# Patient Record
Sex: Female | Born: 1955 | Race: Black or African American | Hispanic: No | Smoking: Former smoker
Health system: Southern US, Community
[De-identification: ages and names within clinical notes are randomized; demographics above are authoritative.]

## PROBLEM LIST (undated history)

## (undated) DIAGNOSIS — E119 Type 2 diabetes mellitus without complications: Secondary | ICD-10-CM

## (undated) DIAGNOSIS — E785 Hyperlipidemia, unspecified: Secondary | ICD-10-CM

## (undated) DIAGNOSIS — C50919 Malignant neoplasm of unspecified site of unspecified female breast: Secondary | ICD-10-CM

## (undated) DIAGNOSIS — I10 Essential (primary) hypertension: Secondary | ICD-10-CM

## (undated) DIAGNOSIS — K219 Gastro-esophageal reflux disease without esophagitis: Secondary | ICD-10-CM

---

## 1991-11-08 DIAGNOSIS — C50919 Malignant neoplasm of unspecified site of unspecified female breast: Secondary | ICD-10-CM

## 1991-11-08 HISTORY — DX: Malignant neoplasm of unspecified site of unspecified female breast: C50.919

## 1991-11-08 HISTORY — PX: MASTECTOMY: SHX3

## 2004-09-17 ENCOUNTER — Ambulatory Visit: Payer: Self-pay

## 2004-11-22 ENCOUNTER — Ambulatory Visit: Payer: Self-pay | Admitting: Oncology

## 2004-12-29 ENCOUNTER — Ambulatory Visit: Payer: Self-pay | Admitting: Oncology

## 2005-01-03 ENCOUNTER — Ambulatory Visit: Payer: Self-pay | Admitting: Otolaryngology

## 2005-06-27 ENCOUNTER — Ambulatory Visit: Payer: Self-pay | Admitting: Oncology

## 2005-10-03 ENCOUNTER — Ambulatory Visit: Payer: Self-pay | Admitting: Oncology

## 2006-05-08 ENCOUNTER — Ambulatory Visit: Payer: Self-pay | Admitting: Gastroenterology

## 2006-06-22 ENCOUNTER — Ambulatory Visit: Payer: Self-pay | Admitting: Oncology

## 2006-10-04 ENCOUNTER — Ambulatory Visit: Payer: Self-pay | Admitting: Oncology

## 2007-10-08 ENCOUNTER — Ambulatory Visit: Payer: Self-pay | Admitting: *Deleted

## 2008-10-08 ENCOUNTER — Ambulatory Visit: Payer: Self-pay | Admitting: Family Medicine

## 2009-10-13 ENCOUNTER — Ambulatory Visit: Payer: Self-pay | Admitting: Family Medicine

## 2010-03-12 ENCOUNTER — Ambulatory Visit: Payer: Self-pay | Admitting: Family Medicine

## 2010-10-14 ENCOUNTER — Ambulatory Visit: Payer: Self-pay | Admitting: Family Medicine

## 2011-10-20 ENCOUNTER — Ambulatory Visit: Payer: Self-pay | Admitting: Family Medicine

## 2012-08-06 ENCOUNTER — Ambulatory Visit: Payer: Self-pay | Admitting: Gastroenterology

## 2012-08-08 LAB — PATHOLOGY REPORT

## 2012-11-20 ENCOUNTER — Ambulatory Visit: Payer: Self-pay

## 2013-01-18 ENCOUNTER — Ambulatory Visit: Payer: Self-pay | Admitting: Family Medicine

## 2013-01-24 ENCOUNTER — Emergency Department: Payer: Self-pay | Admitting: Emergency Medicine

## 2013-01-24 LAB — URINALYSIS, COMPLETE
Bilirubin,UR: NEGATIVE
Glucose,UR: 50 mg/dL (ref 0–75)
Nitrite: NEGATIVE
Protein: 30
Squamous Epithelial: 3
WBC UR: 3 /HPF (ref 0–5)

## 2013-01-24 LAB — CBC
HCT: 40 % (ref 35.0–47.0)
MCH: 28.9 pg (ref 26.0–34.0)
Platelet: 295 10*3/uL (ref 150–440)
RBC: 4.58 10*6/uL (ref 3.80–5.20)
WBC: 8 10*3/uL (ref 3.6–11.0)

## 2013-01-24 LAB — BASIC METABOLIC PANEL
Anion Gap: 8 (ref 7–16)
Co2: 27 mmol/L (ref 21–32)
EGFR (Non-African Amer.): >60
Glucose: 166 mg/dL — ABNORMAL HIGH (ref 65–99)
Osmolality: 283 (ref 275–301)
Potassium: 3.5 mmol/L (ref 3.5–5.1)
Sodium: 138 mmol/L (ref 136–145)

## 2013-10-31 ENCOUNTER — Emergency Department: Payer: Self-pay | Admitting: Emergency Medicine

## 2013-12-03 ENCOUNTER — Ambulatory Visit: Payer: Self-pay | Admitting: Family Medicine

## 2014-12-04 ENCOUNTER — Ambulatory Visit: Payer: Self-pay | Admitting: Family Medicine

## 2015-12-24 ENCOUNTER — Other Ambulatory Visit: Payer: Self-pay | Admitting: Family Medicine

## 2015-12-24 DIAGNOSIS — Z1231 Encounter for screening mammogram for malignant neoplasm of breast: Secondary | ICD-10-CM

## 2015-12-30 ENCOUNTER — Ambulatory Visit
Admission: RE | Admit: 2015-12-30 | Discharge: 2015-12-30 | Disposition: A | Discharge status: Home / Self Care | Payer: BC Managed Care – PPO | Source: Ambulatory Visit | Attending: Family Medicine | Admitting: Family Medicine

## 2015-12-30 DIAGNOSIS — Z1231 Encounter for screening mammogram for malignant neoplasm of breast: Secondary | ICD-10-CM | POA: Insufficient documentation

## 2015-12-30 HISTORY — DX: Malignant neoplasm of unspecified site of unspecified female breast: C50.919

## 2016-12-05 ENCOUNTER — Encounter: Payer: Self-pay | Admitting: Emergency Medicine

## 2016-12-05 ENCOUNTER — Emergency Department
Admission: EM | Admit: 2016-12-05 | Discharge: 2016-12-05 | Disposition: A | Discharge status: Home / Self Care | Payer: BC Managed Care – PPO | Attending: Emergency Medicine | Admitting: Emergency Medicine

## 2016-12-05 ENCOUNTER — Emergency Department: Payer: BC Managed Care – PPO

## 2016-12-05 DIAGNOSIS — S52615A Nondisplaced fracture of left ulna styloid process, initial encounter for closed fracture: Secondary | ICD-10-CM | POA: Diagnosis not present

## 2016-12-05 DIAGNOSIS — W1809XA Striking against other object with subsequent fall, initial encounter: Secondary | ICD-10-CM | POA: Diagnosis not present

## 2016-12-05 DIAGNOSIS — Y999 Unspecified external cause status: Secondary | ICD-10-CM | POA: Insufficient documentation

## 2016-12-05 DIAGNOSIS — E119 Type 2 diabetes mellitus without complications: Secondary | ICD-10-CM | POA: Diagnosis not present

## 2016-12-05 DIAGNOSIS — Y9389 Activity, other specified: Secondary | ICD-10-CM | POA: Diagnosis not present

## 2016-12-05 DIAGNOSIS — Y929 Unspecified place or not applicable: Secondary | ICD-10-CM | POA: Diagnosis not present

## 2016-12-05 DIAGNOSIS — I10 Essential (primary) hypertension: Secondary | ICD-10-CM | POA: Insufficient documentation

## 2016-12-05 DIAGNOSIS — Z853 Personal history of malignant neoplasm of breast: Secondary | ICD-10-CM | POA: Insufficient documentation

## 2016-12-05 DIAGNOSIS — S52552A Other extraarticular fracture of lower end of left radius, initial encounter for closed fracture: Secondary | ICD-10-CM | POA: Diagnosis not present

## 2016-12-05 DIAGNOSIS — S6992XA Unspecified injury of left wrist, hand and finger(s), initial encounter: Secondary | ICD-10-CM | POA: Diagnosis present

## 2016-12-05 HISTORY — DX: Type 2 diabetes mellitus without complications: E11.9

## 2016-12-05 HISTORY — DX: Essential (primary) hypertension: I10

## 2016-12-05 MED ORDER — OXYCODONE-ACETAMINOPHEN 5-325 MG PO TABS: 1.0000 | ORAL_TABLET | Freq: Three times a day (TID) | ORAL | 0 refills | Status: AC | PRN

## 2016-12-05 MED ORDER — OXYCODONE-ACETAMINOPHEN 5-325 MG PO TABS
1.0000 | ORAL_TABLET | Freq: Once | ORAL | Status: AC
  Administered 2016-12-05: 1 via ORAL
  Filled 2016-12-05: qty 1

## 2016-12-05 MED ORDER — MELOXICAM 7.5 MG PO TABS: 7.5000 mg | ORAL_TABLET | Freq: Every day | ORAL | 1 refills | Status: AC

## 2016-12-05 NOTE — ED Provider Notes (Signed)
[facility] Emergency Department Provider Note  ____________________________________________  Time seen: Approximately 12:49 PM  I have reviewed the triage vital signs and the nursing notes.   HISTORY  Chief Complaint Wrist Pain    HPI Janice Richmond is a 61 y.o. female presenting to the emergency department with 4/10 acute non-radiating left wrist pain. Patient states that left wrist pain is worsened with movement.  Patient was bent over picking up toys and lost her balance, falling onto her left outstretched hand. Patient did hit her head or lose consciousness during the fall. Patient denies prior traumas or surgeries to the left upper extremity. Patient is right-handed. She has been experiencing left upper extremity avoidance. Patient has used over-the-counter arthritis cream but has attempted no other alleviating measures. Patient enjoys watching her grandson and granddaughter in her spare time.   Past Medical History:  Diagnosis Date  . Breast cancer ([place]) 1993   left breast  . Diabetes mellitus without complication ([place])   . Hypertension     There are no active problems to display for this patient.   Past Surgical History:  Procedure Laterality Date  . MASTECTOMY Left 1993   mastectomy with chemotherapy    Prior to Admission medications   Medication Sig Start Date End Date Taking? Authorizing Provider  meloxicam (MOBIC) 7.5 MG tablet Take 1 tablet (7.5 mg total) by mouth daily. 12/05/16 12/10/16  Lannie Fields, PA-C  oxyCODONE-acetaminophen (ROXICET) 5-325 MG tablet Take 1 tablet by mouth every 8 (eight) hours as needed. 12/05/16 12/08/16  Lannie Fields, PA-C    Allergies Patient has no known allergies.  Family History  Problem Relation Age of Onset  . Breast cancer Other 40    did have BRCA testing and it was negative  . Pancreatic cancer Other 70    two nephews  . Breast cancer Sister 33    2 sisters, ages 31 and 40  . Pancreatic  cancer Mother 19    Social History Social History  Substance Use Topics  . Smoking status: Not on file  . Smokeless tobacco: Not on file  . Alcohol use Not on file     Review of Systems  Constitutional: No fever/chills Cardiovascular: no chest pain. Respiratory: no cough. No SOB. Gastrointestinal: No abdominal pain.  No nausea, no vomiting.  No diarrhea.  No constipation. Genitourinary: Negative for dysuria. No hematuria Musculoskeletal: Patient has left wrist pain.  Skin: Patient has edema of the left wrist.  Neurological: Negative for headaches, focal weakness or numbness.   ____________________________________________   PHYSICAL EXAM:  VITAL SIGNS: ED Triage Vitals  Enc Vitals Group     BP 12/05/16 1131 (!) 161/84     Pulse Rate 12/05/16 1131 69     Resp 12/05/16 1131 16     Temp 12/05/16 1131 98.8 F (37.1 C)     Temp Source 12/05/16 1131 Oral     SpO2 12/05/16 1131 98 %     Weight 12/05/16 1131 180 lb (81.6 kg)     Height 12/05/16 1131 '5\' 5"'  (1.651 m)     Head Circumference --      Peak Flow --      Pain Score 12/05/16 1132 4     Pain Loc --      Pain Edu? --      Excl. in [place]? --      Constitutional: Alert and oriented. Well appearing and in no acute distress. Eyes: Conjunctivae are normal. PERRL.  EOMI. Head: Atraumatic. Neck: No stridor. FROM. No pain with palpation along the C-spine. Cardiovascular: Normal rate, regular rhythm. Normal S1 and S2.  Good peripheral circulation. Respiratory: Normal respiratory effort without tachypnea or retractions. Lungs CTAB. Good air entry to the bases with no decreased or absent breath sounds. Musculoskeletal: To inspection, left wrist is edematous. Patient has 5 out of 5 strength in the upper extremities bilaterally. Patient has full range of motion at the left elbow and limited flexion and extension at the left wrist, likely secondary to pain. Patient is able to move all 5 left fingers. Palpable radial and ulnar pulses  bilaterally and symmetrically. Neurologic:  Normal speech and language. No gross focal neurologic deficits are appreciated. Reflexes are 2+ and symmetric in the lower extremities bilaterally. Skin:  Skin is warm, dry and intact. No rash noted. Psychiatric: Mood and affect are normal. Speech and behavior are normal. Patient exhibits appropriate insight and judgement.   ____________________________________________   LABS (all labs ordered are listed, but only abnormal results are displayed)  Labs Reviewed - No data to display ____________________________________________  EKG   ____________________________________________  RADIOLOGY Unk Pinto, personally viewed and evaluated these images (plain radiographs) as part of my medical decision making, as well as reviewing the written report by the radiologist.  Dg Wrist Complete Left  Result Date: 12/05/2016 CLINICAL DATA:  Fall on outstretched hand with left wrist pain, initial encounter EXAM: LEFT WRIST - COMPLETE 3+ VIEW COMPARISON:  None. FINDINGS: There is an oblique fracture through the distal radius as well as a transverse fracture through the ulnar styloid. Radial fracture somewhat comminuted and appears to extend ports the articular surface. No other fracture is seen. Specifically the scaphoid shows no fracture. IMPRESSION: Distal radial and ulnar fractures. Electronically Signed   By: Inez Catalina M.D.   On: 12/05/2016 12:01    ____________________________________________    PROCEDURES  Procedure(s) performed:    Procedures  SPLINT APPLICATION Date/Time: 9:37 PM Authorized by: Lannie Fields Consent: Verbal consent obtained. Risks and benefits: risks, benefits and alternatives were discussed Consent given by: patient Splint applied by: orthopedic technician Location details: Left wrist Splint type: Sugar tong  Post-procedure: The splinted body part was neurovascularly unchanged following the procedure. Patient  tolerance: Patient tolerated the procedure well with no immediate complications.   Medications  oxyCODONE-acetaminophen (PERCOCET/ROXICET) 5-325 MG per tablet 1 tablet (not administered)     ____________________________________________   INITIAL IMPRESSION / ASSESSMENT AND PLAN / ED COURSE  Pertinent labs & imaging results that were available during my care of the patient were reviewed by me and considered in my medical decision making (see chart for details).  Review of the [place] CSRS was performed in accordance of the [place] prior to dispensing any controlled drugs.     Assessment and Plan: Distal Radius Fracture: Ulnar Styloid Fracture: Patient presents to the emergency department with left wrist pain after sustaining a fall earlier today. Patient has an oblique fracture through the distal radius and a transverse fracture through the styloid process of ulna. A splint was applied in the emergency department and Percocet was given for pain. Patient was neurovascularly intact after splint application. Patient was discharged with Percocet and referred to orthopedics, Dr. Sabra Heck. Patient was advised to make an appointment as soon as possible. All patient questions were answered. ___________________________________________  FINAL CLINICAL IMPRESSION(S) / ED DIAGNOSES  Final diagnoses:  Other closed extra-articular fracture of distal end of left radius, initial encounter  Closed nondisplaced  fracture of styloid process of left ulna, initial encounter      NEW MEDICATIONS STARTED DURING THIS VISIT:  New Prescriptions   MELOXICAM (MOBIC) 7.5 MG TABLET    Take 1 tablet (7.5 mg total) by mouth daily.   OXYCODONE-ACETAMINOPHEN (ROXICET) 5-325 MG TABLET    Take 1 tablet by mouth every 8 (eight) hours as needed.        This chart was dictated using voice recognition software/Dragon. Despite best efforts to proofread, errors can occur which can change the meaning. Any change was purely  unintentional.    Lannie Fields, PA-C 12/05/16 1306    Harvest Dark, MD 12/05/16 1450

## 2016-12-05 NOTE — ED Notes (Signed)
See triage note  States she fell yesterday  Landed on left wrist  Positive swelling positive pulses

## 2016-12-05 NOTE — ED Triage Notes (Signed)
Pt here with left wrist pain after a fall last night, denies hitting head, denies LOC.

## 2017-03-09 ENCOUNTER — Other Ambulatory Visit: Payer: Self-pay | Admitting: Family Medicine

## 2017-03-09 DIAGNOSIS — Z1231 Encounter for screening mammogram for malignant neoplasm of breast: Secondary | ICD-10-CM

## 2017-04-20 ENCOUNTER — Ambulatory Visit
Admission: RE | Admit: 2017-04-20 | Discharge: 2017-04-20 | Disposition: A | Discharge status: Home / Self Care | Payer: BC Managed Care – PPO | Source: Ambulatory Visit | Attending: Family Medicine | Admitting: Family Medicine

## 2017-04-20 DIAGNOSIS — Z9012 Acquired absence of left breast and nipple: Secondary | ICD-10-CM | POA: Diagnosis not present

## 2017-04-20 DIAGNOSIS — Z1231 Encounter for screening mammogram for malignant neoplasm of breast: Secondary | ICD-10-CM | POA: Insufficient documentation

## 2017-04-20 DIAGNOSIS — Z853 Personal history of malignant neoplasm of breast: Secondary | ICD-10-CM | POA: Diagnosis not present

## 2018-04-03 ENCOUNTER — Other Ambulatory Visit: Payer: Self-pay | Admitting: Family Medicine

## 2018-04-03 DIAGNOSIS — Z1231 Encounter for screening mammogram for malignant neoplasm of breast: Secondary | ICD-10-CM

## 2018-04-23 ENCOUNTER — Ambulatory Visit
Admission: RE | Admit: 2018-04-23 | Discharge: 2018-04-23 | Disposition: A | Discharge status: Home / Self Care | Payer: BC Managed Care – PPO | Source: Ambulatory Visit | Attending: Family Medicine | Admitting: Family Medicine

## 2018-04-23 DIAGNOSIS — Z1231 Encounter for screening mammogram for malignant neoplasm of breast: Secondary | ICD-10-CM | POA: Insufficient documentation

## 2019-04-12 ENCOUNTER — Other Ambulatory Visit: Payer: Self-pay | Admitting: Family Medicine

## 2019-04-12 DIAGNOSIS — Z1231 Encounter for screening mammogram for malignant neoplasm of breast: Secondary | ICD-10-CM

## 2019-05-24 ENCOUNTER — Other Ambulatory Visit: Payer: Self-pay

## 2019-05-24 ENCOUNTER — Ambulatory Visit
Admission: RE | Admit: 2019-05-24 | Discharge: 2019-05-24 | Disposition: A | Discharge status: Home / Self Care | Payer: BC Managed Care – PPO | Source: Ambulatory Visit | Attending: Family Medicine | Admitting: Family Medicine

## 2019-05-24 DIAGNOSIS — Z1231 Encounter for screening mammogram for malignant neoplasm of breast: Secondary | ICD-10-CM | POA: Diagnosis present

## 2020-04-16 ENCOUNTER — Other Ambulatory Visit: Payer: Self-pay | Admitting: Family Medicine

## 2020-04-16 DIAGNOSIS — Z1231 Encounter for screening mammogram for malignant neoplasm of breast: Secondary | ICD-10-CM

## 2020-05-25 ENCOUNTER — Ambulatory Visit
Admission: RE | Admit: 2020-05-25 | Discharge: 2020-05-25 | Disposition: A | Discharge status: Home / Self Care | Payer: BC Managed Care – PPO | Source: Ambulatory Visit | Attending: Family Medicine | Admitting: Family Medicine

## 2020-05-25 DIAGNOSIS — Z1231 Encounter for screening mammogram for malignant neoplasm of breast: Secondary | ICD-10-CM | POA: Insufficient documentation

## 2021-01-31 ENCOUNTER — Emergency Department: Payer: BC Managed Care – PPO

## 2021-01-31 ENCOUNTER — Observation Stay: Payer: BC Managed Care – PPO

## 2021-01-31 ENCOUNTER — Other Ambulatory Visit: Payer: Self-pay

## 2021-01-31 ENCOUNTER — Encounter: Payer: Self-pay | Admitting: Emergency Medicine

## 2021-01-31 ENCOUNTER — Observation Stay
Admission: EM | Admit: 2021-01-31 | Discharge: 2021-02-03 | Disposition: A | Discharge status: Home / Self Care | Payer: BC Managed Care – PPO | Attending: Internal Medicine | Admitting: Internal Medicine

## 2021-01-31 DIAGNOSIS — E119 Type 2 diabetes mellitus without complications: Secondary | ICD-10-CM | POA: Diagnosis not present

## 2021-01-31 DIAGNOSIS — E1169 Type 2 diabetes mellitus with other specified complication: Secondary | ICD-10-CM

## 2021-01-31 DIAGNOSIS — K219 Gastro-esophageal reflux disease without esophagitis: Secondary | ICD-10-CM | POA: Diagnosis present

## 2021-01-31 DIAGNOSIS — I10 Essential (primary) hypertension: Secondary | ICD-10-CM | POA: Diagnosis not present

## 2021-01-31 DIAGNOSIS — Z853 Personal history of malignant neoplasm of breast: Secondary | ICD-10-CM | POA: Diagnosis not present

## 2021-01-31 DIAGNOSIS — I639 Cerebral infarction, unspecified: Secondary | ICD-10-CM | POA: Insufficient documentation

## 2021-01-31 DIAGNOSIS — Z87891 Personal history of nicotine dependence: Secondary | ICD-10-CM | POA: Diagnosis not present

## 2021-01-31 DIAGNOSIS — Z20822 Contact with and (suspected) exposure to covid-19: Secondary | ICD-10-CM | POA: Insufficient documentation

## 2021-01-31 DIAGNOSIS — H53461 Homonymous bilateral field defects, right side: Principal | ICD-10-CM | POA: Insufficient documentation

## 2021-01-31 DIAGNOSIS — I428 Other cardiomyopathies: Secondary | ICD-10-CM | POA: Diagnosis not present

## 2021-01-31 DIAGNOSIS — Z79899 Other long term (current) drug therapy: Secondary | ICD-10-CM | POA: Diagnosis not present

## 2021-01-31 DIAGNOSIS — Z7984 Long term (current) use of oral hypoglycemic drugs: Secondary | ICD-10-CM | POA: Insufficient documentation

## 2021-01-31 DIAGNOSIS — I429 Cardiomyopathy, unspecified: Secondary | ICD-10-CM

## 2021-01-31 DIAGNOSIS — E785 Hyperlipidemia, unspecified: Secondary | ICD-10-CM | POA: Insufficient documentation

## 2021-01-31 DIAGNOSIS — R519 Headache, unspecified: Secondary | ICD-10-CM | POA: Diagnosis present

## 2021-01-31 HISTORY — DX: Gastro-esophageal reflux disease without esophagitis: K21.9

## 2021-01-31 HISTORY — DX: Hyperlipidemia, unspecified: E78.5

## 2021-01-31 LAB — URINE DRUG SCREEN, QUALITATIVE (ARMC ONLY)
Amphetamines, Ur Screen: NOT DETECTED
Barbiturates, Ur Screen: NOT DETECTED
Benzodiazepine, Ur Scrn: NOT DETECTED
Cannabinoid 50 Ng, Ur ~~LOC~~: NOT DETECTED
Cocaine Metabolite,Ur ~~LOC~~: NOT DETECTED
MDMA (Ecstasy)Ur Screen: NOT DETECTED
Methadone Scn, Ur: NOT DETECTED
Opiate, Ur Screen: NOT DETECTED
Phencyclidine (PCP) Ur S: NOT DETECTED
Tricyclic, Ur Screen: NOT DETECTED

## 2021-01-31 LAB — DIFFERENTIAL
Abs Immature Granulocytes: 0.04 10*3/uL (ref 0.00–0.07)
Basophils Absolute: 0.1 10*3/uL (ref 0.0–0.1)
Basophils Relative: 1 %
Eosinophils Absolute: 0.1 10*3/uL (ref 0.0–0.5)
Eosinophils Relative: 1 %
Immature Granulocytes: 1 %
Lymphocytes Relative: 18 %
Lymphs Abs: 1.5 10*3/uL (ref 0.7–4.0)
Monocytes Absolute: 0.6 10*3/uL (ref 0.1–1.0)
Monocytes Relative: 7 %
Neutro Abs: 6.1 10*3/uL (ref 1.7–7.7)
Neutrophils Relative %: 72 %

## 2021-01-31 LAB — APTT: aPTT: 26 seconds (ref 24–36)

## 2021-01-31 LAB — URINALYSIS, ROUTINE W REFLEX MICROSCOPIC
Bacteria, UA: NONE SEEN
Bilirubin Urine: NEGATIVE
Glucose, UA: 150 mg/dL — AB
Ketones, ur: NEGATIVE mg/dL
Leukocytes,Ua: NEGATIVE
Nitrite: NEGATIVE
Protein, ur: 30 mg/dL — AB
Specific Gravity, Urine: 1.031 — ABNORMAL HIGH (ref 1.005–1.030)
pH: 6 (ref 5.0–8.0)

## 2021-01-31 LAB — COMPREHENSIVE METABOLIC PANEL
ALT: 24 U/L (ref 0–44)
AST: 22 U/L (ref 15–41)
Albumin: 4.1 g/dL (ref 3.5–5.0)
Alkaline Phosphatase: 81 U/L (ref 38–126)
Anion gap: 10 (ref 5–15)
BUN: 16 mg/dL (ref 8–23)
CO2: 24 mmol/L (ref 22–32)
Calcium: 8.9 mg/dL (ref 8.9–10.3)
Chloride: 103 mmol/L (ref 98–111)
Creatinine, Ser: 0.5 mg/dL (ref 0.44–1.00)
GFR, Estimated: >60 mL/min (ref >60)
Glucose, Bld: 189 mg/dL — ABNORMAL HIGH (ref 70–99)
Potassium: 3.8 mmol/L (ref 3.5–5.1)
Sodium: 137 mmol/L (ref 135–145)
Total Bilirubin: 0.5 mg/dL (ref 0.3–1.2)
Total Protein: 7.7 g/dL (ref 6.5–8.1)

## 2021-01-31 LAB — CBG MONITORING, ED: Glucose-Capillary: 179 mg/dL — ABNORMAL HIGH (ref 70–99)

## 2021-01-31 LAB — GLUCOSE, CAPILLARY
Glucose-Capillary: 179 mg/dL — ABNORMAL HIGH (ref 70–99)
Glucose-Capillary: 185 mg/dL — ABNORMAL HIGH (ref 70–99)
Glucose-Capillary: 169 mg/dL — ABNORMAL HIGH (ref 70–99)

## 2021-01-31 LAB — CBC
HCT: 44.5 % (ref 36.0–46.0)
Hemoglobin: 13.9 g/dL (ref 12.0–15.0)
MCH: 28.1 pg (ref 26.0–34.0)
MCHC: 31.2 g/dL (ref 30.0–36.0)
MCV: 90.1 fL (ref 80.0–100.0)
Platelets: 270 10*3/uL (ref 150–400)
RBC: 4.94 MIL/uL (ref 3.87–5.11)
RDW: 15 % (ref 11.5–15.5)
WBC: 8.3 10*3/uL (ref 4.0–10.5)
nRBC: 0 % (ref 0.0–0.2)

## 2021-01-31 LAB — PROTIME-INR
INR: 1.1 (ref 0.8–1.2)
Prothrombin Time: 13.6 seconds (ref 11.4–15.2)

## 2021-01-31 LAB — BRAIN NATRIURETIC PEPTIDE: B Natriuretic Peptide: 382.1 pg/mL — ABNORMAL HIGH (ref 0.0–100.0)

## 2021-01-31 LAB — RESP PANEL BY RT-PCR (FLU A&B, COVID) ARPGX2
Influenza A by PCR: NEGATIVE
Influenza B by PCR: NEGATIVE
SARS Coronavirus 2 by RT PCR: NEGATIVE

## 2021-01-31 LAB — ETHANOL: Alcohol, Ethyl (B): <10 mg/dL (ref <10)

## 2021-01-31 LAB — I-STAT CREATININE, ED: Creatinine, Ser: 0.5 mg/dL (ref 0.44–1.00)

## 2021-01-31 MED ORDER — LATANOPROST 0.005 % OP SOLN
1.0000 [drp] | Freq: Every day | OPHTHALMIC | Status: DC
  Administered 2021-01-31 – 2021-02-02 (×3): 1 [drp] via OPHTHALMIC
  Filled 2021-01-31: qty 2.5

## 2021-01-31 MED ORDER — ASPIRIN EC 325 MG PO TBEC
325.0000 mg | DELAYED_RELEASE_TABLET | Freq: Every day | ORAL | Status: DC
  Administered 2021-01-31: 325 mg via ORAL
  Filled 2021-01-31: qty 1

## 2021-01-31 MED ORDER — ACETAMINOPHEN 325 MG PO TABS
650.0000 mg | ORAL_TABLET | ORAL | Status: DC | PRN
  Administered 2021-01-31: 650 mg via ORAL
  Filled 2021-01-31: qty 2

## 2021-01-31 MED ORDER — ASPIRIN 300 MG RE SUPP
300.0000 mg | Freq: Every day | RECTAL | Status: DC
  Filled 2021-01-31: qty 1

## 2021-01-31 MED ORDER — STROKE: EARLY STAGES OF RECOVERY BOOK: Freq: Once | Status: AC

## 2021-01-31 MED ORDER — SENNOSIDES-DOCUSATE SODIUM 8.6-50 MG PO TABS: 1.0000 | ORAL_TABLET | Freq: Every evening | ORAL | Status: DC | PRN

## 2021-01-31 MED ORDER — INSULIN ASPART 100 UNIT/ML ~~LOC~~ SOLN: 0.0000 [IU] | Freq: Every day | SUBCUTANEOUS | Status: DC

## 2021-01-31 MED ORDER — ACETAMINOPHEN 650 MG RE SUPP: 650.0000 mg | RECTAL | Status: DC | PRN

## 2021-01-31 MED ORDER — IOHEXOL 350 MG/ML SOLN
75.0000 mL | Freq: Once | INTRAVENOUS | Status: AC | PRN
  Administered 2021-01-31: 75 mL via INTRAVENOUS

## 2021-01-31 MED ORDER — ENOXAPARIN SODIUM 40 MG/0.4ML ~~LOC~~ SOLN
0.5000 mg/kg | SUBCUTANEOUS | Status: DC
  Filled 2021-01-31: qty 0.8

## 2021-01-31 MED ORDER — INSULIN ASPART 100 UNIT/ML ~~LOC~~ SOLN
0.0000 [IU] | Freq: Three times a day (TID) | SUBCUTANEOUS | Status: DC
  Administered 2021-01-31 (×2): 2 [IU] via SUBCUTANEOUS
  Administered 2021-02-01: 13:00:00 3 [IU] via SUBCUTANEOUS
  Administered 2021-02-01 – 2021-02-03 (×5): 2 [IU] via SUBCUTANEOUS
  Filled 2021-01-31 (×5): qty 1

## 2021-01-31 MED ORDER — ATORVASTATIN CALCIUM 20 MG PO TABS: 80.0000 mg | ORAL_TABLET | Freq: Every day | ORAL | Status: DC

## 2021-01-31 MED ORDER — ASPIRIN 300 MG RE SUPP: 300.0000 mg | Freq: Every day | RECTAL | Status: DC

## 2021-01-31 MED ORDER — ATORVASTATIN CALCIUM 20 MG PO TABS
80.0000 mg | ORAL_TABLET | Freq: Every day | ORAL | Status: DC
  Administered 2021-01-31: 80 mg via ORAL
  Filled 2021-01-31 (×2): qty 4

## 2021-01-31 MED ORDER — HYDRALAZINE HCL 20 MG/ML IJ SOLN: 5.0000 mg | INTRAMUSCULAR | Status: DC | PRN

## 2021-01-31 MED ORDER — PANTOPRAZOLE SODIUM 40 MG PO TBEC
40.0000 mg | DELAYED_RELEASE_TABLET | Freq: Every day | ORAL | Status: DC
  Administered 2021-01-31 – 2021-02-03 (×4): 40 mg via ORAL
  Filled 2021-01-31 (×4): qty 1

## 2021-01-31 MED ORDER — CLOPIDOGREL BISULFATE 75 MG PO TABS
75.0000 mg | ORAL_TABLET | Freq: Every day | ORAL | Status: DC
  Administered 2021-02-01 – 2021-02-03 (×3): 75 mg via ORAL
  Filled 2021-01-31 (×3): qty 1

## 2021-01-31 MED ORDER — ASPIRIN EC 81 MG PO TBEC
81.0000 mg | DELAYED_RELEASE_TABLET | Freq: Every day | ORAL | Status: DC
  Administered 2021-02-01 – 2021-02-03 (×3): 81 mg via ORAL
  Filled 2021-01-31 (×3): qty 1

## 2021-01-31 MED ORDER — CLOPIDOGREL BISULFATE 75 MG PO TABS
300.0000 mg | ORAL_TABLET | Freq: Once | ORAL | Status: AC
  Administered 2021-01-31: 17:00:00 300 mg via ORAL
  Filled 2021-01-31: qty 4

## 2021-01-31 MED ORDER — HYDROXYZINE HCL 50 MG/ML IM SOLN
25.0000 mg | Freq: Four times a day (QID) | INTRAMUSCULAR | Status: DC | PRN
  Filled 2021-01-31: qty 0.5

## 2021-01-31 MED ORDER — ASPIRIN 325 MG PO TABS
325.0000 mg | ORAL_TABLET | Freq: Every day | ORAL | Status: DC
  Filled 2021-01-31 (×2): qty 1

## 2021-01-31 MED ORDER — IOHEXOL 350 MG/ML SOLN: 100.0000 mL | Freq: Once | INTRAVENOUS | Status: DC | PRN

## 2021-01-31 MED ORDER — TIMOLOL MALEATE 0.5 % OP SOLN
1.0000 [drp] | Freq: Every day | OPHTHALMIC | Status: DC
  Administered 2021-01-31 – 2021-02-03 (×4): 1 [drp] via OPHTHALMIC
  Filled 2021-01-31: qty 5

## 2021-01-31 MED ORDER — CARVEDILOL 25 MG PO TABS
25.0000 mg | ORAL_TABLET | Freq: Two times a day (BID) | ORAL | Status: DC
  Administered 2021-01-31 – 2021-02-03 (×7): 25 mg via ORAL
  Filled 2021-01-31 (×7): qty 1

## 2021-01-31 MED ORDER — ATORVASTATIN CALCIUM 20 MG PO TABS: 40.0000 mg | ORAL_TABLET | Freq: Every day | ORAL | Status: DC

## 2021-01-31 MED ORDER — ACETAMINOPHEN 160 MG/5ML PO SOLN
650.0000 mg | ORAL | Status: DC | PRN
  Filled 2021-01-31: qty 20.3

## 2021-01-31 NOTE — ED Notes (Signed)
Neurologist at patients bedside in [place] at this time

## 2021-01-31 NOTE — Progress Notes (Signed)
PHARMACIST - PHYSICIAN COMMUNICATION  CONCERNING:  Enoxaparin (Lovenox) for DVT Prophylaxis    RECOMMENDATION: Patient was prescribed enoxaprin 40mg  q24 hours for VTE prophylaxis.   Filed Weights   01/31/21 0847 01/31/21 0942  Weight: 81.6 kg (180 lb) 83.9 kg (185 lb)    Body mass index is 30.79 kg/m.  Estimated Creatinine Clearance: 77 mL/min (by C-G formula based on SCr of 0.5 mg/dL).   Based on [place] patient is candidate for enoxaparin 0.5mg /kg TBW SQ every 24 hours based on BMI being >30.   DESCRIPTION: Pharmacy has adjusted enoxaparin dose [facility].  Patient is now receiving enoxaparin 42.5 mg every 24 hours   Benn Moulder, PharmD Pharmacy Resident  01/31/2021 10:29 AM

## 2021-01-31 NOTE — Evaluation (Signed)
Occupational Therapy Evaluation Patient Details Name: Janice Richmond MRN: [id number] DOB: 18-Dec-1955 Today's Date: 01/31/2021    History of Present Illness 65 y.o. female with medical history significant of hypertension, HLD, GERD, diabetes mellitus, left breast cancer 1993 (s/p for mastectomy and chemotherapy), who presents with blurry vision and headache. Pt with new onset of acute L occupital infarct.   Clinical Impression   Patient presenting with decreased I in self care, balance, functional mobility, and vision disturbance. Patient reports living with 2 children and 2 grandchildren PTA. Pt is independent in all aspects of self care, IADLs, and mobility without use of AD.  Patient currently functioning at supervision - min guard without use of AD. Pt does endorse blurry vision on R side. When objects presented on the R pt able to locate with cuing for head turn. Pt given visual scanning task with instructions to leave at midline and pt noted to compensate without cuing with multiple head turns and checking work twice for errors. Pt also performing some degree of furniture walking from visual disturbance making her feel "off" as she reports.  Patient will benefit from acute OT to increase overall independence in the areas of ADLs, functional mobility, and safety awareness in order to safely discharge home with family.    Follow Up Recommendations  Outpatient OT;Supervision - Intermittent    Equipment Recommendations  None recommended by OT       Precautions / Restrictions Precautions Precautions: Fall      Mobility Bed Mobility Overal bed mobility: Modified Independent             General bed mobility comments: no physical assistance given    Transfers Overall transfer level: Needs assistance Equipment used: None Transfers: Sit to/from Stand;Stand Pivot Transfers Sit to Stand: Supervision Stand pivot transfers: Min guard            Balance Overall balance  assessment: Needs assistance Sitting-balance support: Feet supported Sitting balance-Leahy Scale: Good     Standing balance support: During functional activity Standing balance-Leahy Scale: Fair Standing balance comment: quick head turns and increased cuing for visual scanning requiring min guard for balance                           ADL either performed or assessed with clinical judgement   ADL                                         General ADL Comments: supervision - min guard for self care tasks and functional mobility without use of AD.     Vision Baseline Vision/History: Wears glasses Wears Glasses: At all times Patient Visual Report: Blurring of vision Vision Assessment?: Yes;Vision impaired- to be further tested in functional context Additional Comments: Pt does not see items introducted from R side during this session unless cued. Pt appears to have possible R hemianopsia. Pt is compensating well during session.            Pertinent Vitals/Pain Pain Assessment: No/denies pain     Hand Dominance Right   Extremity/Trunk Assessment Upper Extremity Assessment Upper Extremity Assessment: Overall WFL for tasks assessed   Lower Extremity Assessment Lower Extremity Assessment: Overall WFL for tasks assessed       Communication Communication Communication: No difficulties   Cognition Arousal/Alertness: Awake/alert Behavior During Therapy: WFL for tasks assessed/performed Overall Cognitive  Status: Within Functional Limits for tasks assessed                                                Home Living Family/patient expects to be discharged to:: Private residence Living Arrangements: Children Available Help at Discharge: Available 24 hours/day Type of Home: House Home Access: [place]: [place]: None          Prior Functioning/Environment Level of  Independence: Independent        Comments: Pt reports living with 2 children and 2 grandchildren (ages 66 and 50). She is independent in all aspects of mobility, IADLs, and ADLs. Pt takes sink bath for safety.        OT Problem List: Decreased strength;Decreased safety awareness;Impaired balance (sitting and/or standing)      OT Treatment/Interventions: Self-care/ADL training;Therapeutic exercise;Energy conservation;Manual therapy;Patient/family education;Balance training;Therapeutic activities;Visual/perceptual remediation/compensation    OT Goals(Current goals can be found in the care plan section) Acute Rehab OT Goals Patient Stated Goal: to go home and get better OT Goal Formulation: With patient Time For Goal Achievement: 02/14/21 Potential to Achieve Goals: Fair ADL Goals Pt Will Transfer to Toilet: with modified independence Pt Will Perform Toileting - Clothing Manipulation and hygiene: with modified independence Additional ADL Goal #1: Pt will utilize visual strategies in order to scan and locate all needed items for self care tasks with increased time.  OT Frequency: Min 2X/week   Barriers to D/C:    none known at this time          AM-PAC OT "6 Clicks" Daily Activity     Outcome Measure Help from another person eating meals?: None Help from another person taking care of personal grooming?: None Help from another person toileting, which includes using toliet, bedpan, or urinal?: A Little Help from another person bathing (including washing, rinsing, drying)?: A Little Help from another person to put on and taking off regular upper body clothing?: None Help from another person to put on and taking off regular lower body clothing?: A Little 6 Click Score: 21   End of Session Nurse Communication: Mobility status  Activity Tolerance: Patient tolerated treatment well Patient left: in bed;with call bell/phone within reach  OT Visit Diagnosis: Unsteadiness on feet  (R26.81);Other (comment) (R hemianopsia) Hemiplegia - caused by: Cerebral infarction                Time: 6803-2122 OT Time Calculation (min): 21 min Charges:  OT General Charges $OT Visit: 1 Visit OT Evaluation $OT Eval Low Complexity: 1 Low OT Treatments $Therapeutic Activity: 8-22 mins  01/31/2021, 2:52 PM

## 2021-01-31 NOTE — Progress Notes (Signed)
OT Cancellation Note  Patient Details Name: Janice Richmond MRN: [id number] DOB: May 04, 1956   Cancelled Treatment:    Reason Eval/Treat Not Completed: Patient at procedure or test/ unavailable. Pt currently off the floor for MRI. OT will follow up when pt is next available.  Darleen Crocker, [place], OTR/L , CBIS ascom [phone]  01/31/21, 1:27 PM   01/31/2021, 1:27 PM

## 2021-01-31 NOTE — ED Notes (Signed)
MD Niu at bedside  

## 2021-01-31 NOTE — ED Provider Notes (Signed)
[facility] Emergency Department Provider Note ____________________________________________   Event Date/Time   First MD Initiated Contact with Patient 01/31/21 [phone]     (approximate)  I have reviewed the triage vital signs and the nursing notes.  HISTORY  Chief Complaint Headache and Blurred Vision   HPI Janice Richmond is a 65 y.o. femalewho presents to the ED for evaluation of headache and blurred vision.   Chart review indicates hx HTN, HLD, DM.  History of glaucoma.  Patient visited ED for evaluation of acute blurry vision and dizziness that started last night around midnight.  Patient reports being seated watching television when she had sudden onset dizziness, nausea and vision changes.  She reports going to bed hoping that her symptoms would improve, but awakening this morning with persistent symptoms, so she presents to the ED for evaluation.    She denies any syncopal episodes and minimizes her headache at this point, indicating that she just has blurry vision.  She reports increased nausea sensation while riding in the car without emesis.  Denies waxing and waning symptoms, and reports persistent blurry vision.  Denies recent illnesses, fevers, syncopal episodes, recent surgeries or interventions.  Denies gait changes, falls.  This has never happened before.  Past Medical History:  Diagnosis Date  . Breast cancer ([place]) 1993   left breast  . Diabetes mellitus without complication ([place])   . Hypertension     There are no problems to display for this patient.   Past Surgical History:  Procedure Laterality Date  . MASTECTOMY Left 1993   mastectomy with chemotherapy    Prior to Admission medications   Not on File    Allergies Patient has no known allergies.  Family History  Problem Relation Age of Onset  . Breast cancer Other 40       did have BRCA testing and it was negative  . Pancreatic cancer Other 31       two nephews  .  Breast cancer Sister 20       2 sisters, ages 40 and 75  . Pancreatic cancer Mother 66    Social History    Review of Systems  Constitutional: No fever/chills Eyes: No visual changes. ENT: No sore throat. Cardiovascular: Denies chest pain. Respiratory: Denies shortness of breath. Gastrointestinal: No abdominal pain.  No nausea, no vomiting.  No diarrhea.  No constipation. Genitourinary: Negative for dysuria. Musculoskeletal: Negative for back pain. Skin: Negative for rash. Neurological: Negative for focal weakness or numbness.  Positive for headache and blurry vision.  ____________________________________________   PHYSICAL EXAM:  VITAL SIGNS: Vitals:   01/31/21 0943 01/31/21 0945  BP:    Pulse: (!) 108 (!) 102  Resp: (!) 21 (!) 23  Temp:    SpO2: (!) 89% 94%     Constitutional: Alert and oriented. Well appearing and in no acute distress. Eyes: Conjunctivae are normal. PERRL. EOMI. Head: Atraumatic. Nose: No congestion/rhinnorhea. Mouth/Throat: Mucous membranes are moist.  Oropharynx non-erythematous. Neck: No stridor. No cervical spine tenderness to palpation. Cardiovascular: Normal rate, regular rhythm. Grossly normal heart sounds.  Good peripheral circulation. Respiratory: Normal respiratory effort.  No retractions. Lungs CTAB. Gastrointestinal: Soft , nondistended, nontender to palpation. No CVA tenderness. Musculoskeletal: No lower extremity tenderness nor edema.  No joint effusions. No signs of acute trauma. Neurologic:  homonymous hemianopsia to the right-sided visual fields is only apparent deficit.  Cranial nerves otherwise intact.  5/5 strength and sensation to all 4 extremities. No dysmetria to  finger-nose-finger Skin:  Skin is warm, dry and intact. No rash noted. Psychiatric: Mood and affect are normal. Speech and behavior are normal.  ____________________________________________   LABS (all labs ordered are listed, but only abnormal results are  displayed)  Labs Reviewed  COMPREHENSIVE METABOLIC PANEL - Abnormal; Notable for the following components:      Result Value   Glucose, Bld 189 (*)    All other components within normal limits  CBG MONITORING, ED - Abnormal; Notable for the following components:   Glucose-Capillary 179 (*)    All other components within normal limits  RESP PANEL BY RT-PCR (FLU A&B, COVID) ARPGX2  ETHANOL  PROTIME-INR  APTT  CBC  DIFFERENTIAL  URINE DRUG SCREEN, QUALITATIVE ([facility] ONLY)  URINALYSIS, ROUTINE W REFLEX MICROSCOPIC  I-STAT CREATININE, ED   ____________________________________________  12 Lead EKG  Sinus rhythm, rate of 105 bpm.  Normal axis.  Left bundle branch block with otherwise normal intervals.  No evidence of acute ischemia per [place] ____________________________________________  RADIOLOGY  ED MD interpretation: CT head reviewed by me with left-sided occipital hypodensity concerning for completed acute infarct  Official radiology report(s): CT HEAD CODE STROKE WO CONTRAST  Result Date: 01/31/2021 CLINICAL DATA:  Code stroke.  Right hemianopsia. EXAM: CT HEAD WITHOUT CONTRAST TECHNIQUE: Contiguous axial images were obtained from the base of the skull through the vertex without intravenous contrast. COMPARISON:  None. FINDINGS: Brain: Completed infarct in the left occipital lobe which is PCA branch territory and involves the visual cortex. No areas of prior infarct. No hemorrhage. Vascular: No hyperdense vessel or unexpected calcification. Skull: Normal. Negative for fracture or focal lesion. Sinuses/Orbits: No acute finding. Other: These results were called by telephone at the time of interpretation on 01/31/2021 at 9:23 am to provider [facility] , who verbally acknowledged these results. ASPECTS [facility] Stroke Program Early CT Score) Not scored with this history IMPRESSION: Acute infarct in the left occipital lobe. Electronically Signed   By: Monte Fantasia M.D.   On:  01/31/2021 09:24    ____________________________________________   PROCEDURES and INTERVENTIONS  Procedure(s) performed (including Critical Care):  .1-3 Lead EKG Interpretation Performed by: Vladimir Crofts, MD Authorized by: Vladimir Crofts, MD     Interpretation: normal     ECG rate:  90   ECG rate assessment: normal     Rhythm: sinus rhythm     Ectopy: none     Conduction: normal   .Critical Care Performed by: Vladimir Crofts, MD Authorized by: Vladimir Crofts, MD   Critical care provider statement:    Critical care time (minutes):  35   Critical care was necessary to treat or prevent imminent or life-threatening deterioration of the following conditions:  CNS failure or compromise   Critical care was time spent personally by me on the following activities:  Discussions with consultants, evaluation of patient's response to treatment, examination of patient, ordering and performing treatments and interventions, ordering and review of laboratory studies, ordering and review of radiographic studies, pulse oximetry, re-evaluation of patient's condition, obtaining history from patient or surrogate and review of old charts    Medications  iohexol (OMNIPAQUE) 350 MG/ML injection 75 mL (75 mLs Intravenous Contrast Given 01/31/21 0929)    ____________________________________________   MDM / ED COURSE   65 year old woman with appropriate risk factors presents to the ED with acute right-sided homonymous hemianopsia with evidence of acute CVA requiring medical admission.  Slightly hypertensive, which we will allow, otherwise hemodynamically stable on room air.  Exam with  isolated visual changes consistent with right-sided homonymous hemianopsia, otherwise no apparent neurologic deficits upon my examination.  Code stroke called due to the acuity of her symptoms.  CT imaging demonstrates completed infarct to her left occipital lobe, consistent with her symptoms.  She otherwise looks well without  evidence of ICH, sepsis or recent illnesses, trauma or significant metabolic derangements.  CTA head pending at the time of admission, and neurology indicates that these results will not change her plan as she is not a candidate for thrombectomy at this time due to infarct being completed.  We will discussed the case with hospitalist medicine for admission for further work-up and management.   Clinical Course as of 01/31/21 [id number]  Nancy Fetter Jan 31, 2021  0905 Code stroke called [DS]  0922 Call from rads, completed infarct to left occipital lobe [DS]  [phone] Neurology has seen the patient, ordered CTA head and recommends hospitalist admit. No TPA or IR intervention indicated [DS]    Clinical Course User Index [DS] Vladimir Crofts, MD    ____________________________________________   FINAL CLINICAL IMPRESSION(S) / ED DIAGNOSES  Final diagnoses:  Right homonymous hemianopsia  Cerebrovascular accident (CVA), unspecified mechanism [facility])     ED Discharge Orders    None       Maximiano Lott   Note:  This document was prepared using Dragon voice recognition software and may include unintentional dictation errors.   Vladimir Crofts, MD 01/31/21 [phone]

## 2021-01-31 NOTE — Consult Note (Signed)
Neurology Consultation Reason for Consult: Visual change Referring Physician: Charlsie Quest  CC: Visual change  History is obtained from: Patient  HPI: Janice Richmond is a 65 y.o. female with a history of hypertension and DM who presents with visual changes that started last night.  She noticed them more prominently around 2 AM.  Given that she was Lucianne Lei positive, a code stroke was activated on arrival and she was taken emergently for CT/CTA which demonstrates a PCA territory infarct with an occlusion in that distribution.  She denies any numbness, weakness, double vision, or other symptoms.   LKW: 11pm last night tpa given?: no, outside window   ROS: A 14 point ROS was performed and is negative except as noted in the HPI.  Past Medical History:  Diagnosis Date  . Breast cancer ([place]) 1993   left breast  . Diabetes mellitus without complication ([place])   . Hypertension      Family History  Problem Relation Age of Onset  . Breast cancer Other 40       did have BRCA testing and it was negative  . Pancreatic cancer Other 23       two nephews  . Breast cancer Sister 30       2 sisters, ages 70 and 19  . Pancreatic cancer Mother 58     Social History:  reports that she has quit smoking. She has never used smokeless tobacco. She reports that she does not drink alcohol and does not use drugs.   Exam: Current vital signs: BP (!) 145/96   Pulse 95   Temp 98.2 F (36.8 C) (Oral)   Resp (!) 21   Ht '5\' 5"'  (1.651 m)   Wt 83.9 kg   SpO2 94%   BMI 30.79 kg/m  Vital signs in last 24 hours: Temp:  [98.2 F (36.8 C)] 98.2 F (36.8 C) (03/27 0837) Pulse Rate:  [88-108] 95 (03/27 1030) Resp:  [16-23] 21 (03/27 1030) BP: (145-156)/(79-96) 145/96 (03/27 1030) SpO2:  [89 %-100 %] 94 % (03/27 1030) Weight:  [81.6 kg-83.9 kg] 83.9 kg (03/27 0942)   Physical Exam  Constitutional: Appears well-developed and well-nourished.  Psych: Affect appropriate to situation Eyes: No scleral  injection HENT: No OP obstruction MSK: no joint deformities.  Cardiovascular: Normal rate and regular rhythm.  Respiratory: Effort normal, non-labored breathing GI: Soft.  No distension. There is no tenderness.  Skin: WDI  Neuro: Mental Status: Patient is awake, alert, oriented to person, place, month, year, and situation. Patient is able to give a clear and coherent history. No signs of aphasia or neglect Cranial Nerves: II: Right hemianopia.  Pupils are equal, round, and reactive to light.   III,IV, VI: EOMI without ptosis or diploplia.  V: Facial sensation is symmetric to temperature VII: Facial movement is symmetric.  VIII: hearing is intact to voice X: Uvula elevates symmetrically XI: Shoulder shrug is symmetric. XII: tongue is midline without atrophy or fasciculations.  Motor: Tone is normal. Bulk is normal. 5/5 strength was present in all four extremities.  Sensory: Sensation is symmetric to light touch and temperature in the arms and legs. Cerebellar: FNF and HKS are intact bilaterally   I have reviewed labs in epic and the results pertinent to this consultation are: UDS-negative CMP - unremarkable  I have reviewed the images obtained: CT head - PCA infarct CTA - PCA branch occlusion  Impression: 65 yo F with DM and HTN with PCA infarct. This can be either embolic  or thrombotic, and she will need further work-up.  She is unfortunately not a candidate for TPA due to being outside the window, and with the area already infarcted, mechanical thrombectomy is also out of the question.  Recommendations: - HgbA1c, fasting lipid panel - MRI of the brain without contrast - Frequent neuro checks - Echocardiogram - Prophylactic therapy- asa 17m daily and plavix 7103mdaily after 30070moad.  - Risk factor modification - Telemetry monitoring - PT consult, OT consult, Speech consult - Stroke team to follow    [place] Rack[facility] [phone] 7pm- 7am, please page neurology on call as listed [place]

## 2021-01-31 NOTE — Evaluation (Signed)
Physical Therapy Evaluation Patient Details Name: Janice Richmond MRN: [id number] DOB: 1956/04/11 Today's Date: 01/31/2021   History of Present Illness  Pt is a 65 y/o F admitted from home on 01/31/21 with c/c of HA & blurry vision. CT of head showed acute stroke in L occipital area. 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 PMH: HTN, HLD, GERD, DM, L breast CA 1993 (s/p mastectomy & chemotherapy)  Clinical Impression  Pt seen for PT evaluation with pt very pleasant & agreeable to session. Pt reports active lifestyle, driving, ambulating without AD, no falls in recent past & cares for 65 y/o during the day. Pt currently requires supervision for transfers & long distance ambulation without AD. PT spends time educating pt on signs/symptoms of stroke, need to be cleared by MD before return to driving, lifestyle modifications (take meds as prescribed by MD, stay active, healthy diet) to limit risk of another stroke, & her current visual field deficits & how this impacts her fall risk. During gait pt prefers to ambulate on L side of hallway & only bumps into door in room on R x 1 time; discussed need for visual scanning of environment upon return home to reduce fall risk. Pt verbalizes understanding of all education. Pt would benefit from additional PT services for high level balance training to further reduce fall risk.     Follow Up Recommendations Outpatient PT    Equipment Recommendations  None recommended by PT    Recommendations for Other Services       Precautions / Restrictions Precautions Precautions: Fall Precaution Comments: R visual field deficits Restrictions Weight Bearing Restrictions: No      Mobility  Bed Mobility Overal bed mobility: Independent             General bed mobility comments: bed flat, no rails    Transfers Overall transfer level: Needs assistance Equipment used: None Transfers: Sit to/from Stand Sit to Stand: Supervision Stand pivot  transfers: Min guard          Ambulation/Gait Ambulation/Gait assistance: Supervision Gait Distance (Feet): 375 Feet Assistive device: None   Gait velocity: decreased      Stairs            Wheelchair Mobility    Modified Rankin (Stroke Patients Only)       Balance Overall balance assessment: Needs assistance Sitting-balance support: Feet supported Sitting balance-Leahy Scale: Normal     Standing balance support: During functional activity Standing balance-Leahy Scale: Good Standing balance comment: quick head turns and increased cuing for visual scanning requiring min guard for balance                             Pertinent Vitals/Pain Pain Assessment: 0-10 Pain Score: 1  Pain Location: L frontal HA Pain Descriptors / Indicators: Aching Pain Intervention(s): Limited activity within patient's tolerance;Monitored during session    Home Living Family/patient expects to be discharged to:: Private residence Living Arrangements: Children (2 adult children & 2 grandchildren) Available Help at Discharge: Family Type of Home: House Home Access: Ramped entrance     Home Layout: One level Home Equipment: None      Prior Function Level of Independence: Independent         Comments: Independent, driving, cares for 65 y/o during the day. Cooks, cleans.     Hand Dominance   Dominant Hand: Right    Extremity/Trunk Assessment   Upper Extremity Assessment Upper  Extremity Assessment: Overall WFL for tasks assessed    Lower Extremity Assessment Lower Extremity Assessment: Overall WFL for tasks assessed (RLE heel to shin slightly less coordinated than LLE)   BLE sensation (to light touch) & proprioception intact BLE    Communication   Communication: No difficulties  Cognition Arousal/Alertness: Awake/alert Behavior During Therapy: WFL for tasks assessed/performed Overall Cognitive Status: Within Functional Limits for tasks assessed                                  General Comments: Very pleasant lady      General Comments General comments (skin integrity, edema, etc.): BP at beginning of session132/87 mmHg in LUE (MAP 98), HR 82 bpm, HR up to 111 bpm with gait    Exercises     Assessment/Plan    PT Assessment Patient needs continued PT services  PT Problem List Decreased coordination;Decreased balance       PT Treatment Interventions DME instruction;Therapeutic activities;Gait training;Therapeutic exercise;Patient/family education;Balance training;Functional mobility training;Neuromuscular re-education    PT Goals (Current goals can be found in the Care Plan section)  Acute Rehab PT Goals Patient Stated Goal: to go home and get better PT Goal Formulation: With patient Time For Goal Achievement: 02/14/21 Potential to Achieve Goals: Good    Frequency 7X/week   Barriers to discharge        Co-evaluation               AM-PAC PT "6 Clicks" Mobility  Outcome Measure Help needed turning from your back to your side while in a flat bed without using bedrails?: None Help needed moving from lying on your back to sitting on the side of a flat bed without using bedrails?: None Help needed moving to and from a bed to a chair (including a wheelchair)?: A Little Help needed standing up from a chair using your arms (e.g., wheelchair or bedside chair)?: None Help needed to walk in hospital room?: A Little Help needed climbing 3-5 steps with a railing? : A Little 6 Click Score: 21    End of Session Equipment Utilized During Treatment: Gait belt Activity Tolerance: Patient tolerated treatment well Patient left: in bed;with call bell/phone within reach;with bed alarm set Nurse Communication: Mobility status PT Visit Diagnosis: Unsteadiness on feet (R26.81)    Time: 1791-5056 PT Time Calculation (min) (ACUTE ONLY): 18 min   Charges:   PT Evaluation $PT Eval Low Complexity: 1 Low PT  Treatments $Therapeutic Activity: 8-22 mins        Lavone Nian, PT, DPT 01/31/21, 3:40 PM   Waunita Schooner 01/31/2021, 3:36 PM

## 2021-01-31 NOTE — H&P (Addendum)
History and Physical    TERAN DAUGHENBAUGH HFW:[id number] DOB: [birth year] DOA: 01/31/2021  Referring MD/NP/PA:   PCP: Marguerita Merles, MD   Patient coming from:  The patient is coming from home.  At baseline, pt is independent for most of ADL.        Chief Complaint: headache and blurry vision  HPI: Janice Richmond is a 65 y.o. female with medical history significant of hypertension, HLD, GERD, diabetes mellitus, left breast cancer 1993 (s/p for mastectomy and chemotherapy), who presents with blurry vision and headache.  Patient states that she suddenly started having headache when she was watching television at midnight.  Then she developed blurred vision in the right eye.  The headache has improved, but she has persistent blurry vision in the right eye.  Denies unilateral numbness or tingling in extremities, no facial droop or slurred speech.  No difficulty talking or swallowing.  Patient has mild dry cough, but no chest pain, shortness breath, fever or chills.  Patient had nausea which has resolved.  Currently no nausea, vomiting, diarrhea or abdominal pain.  No symptoms of UTI.  ED Course: pt was found to have WBC 8.3, INR 1.1, PTT 26, alcohol level less than 10, negative COVID-19 PCR, electrolytes renal function okay, temperature normal, blood pressure 146/82, heart rate 108, 102, RR 23, had an episode of oxygen saturation, but currently 94% on room air. Ct of head showed acute stroke in the left occipital area.  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  Patient is placed on MedSurg bed for observation.  Dr. Leonel Ramsay of neurology is consulted  Review of Systems:   General: no fevers, chills, no body weight gain, fatigue HEENT: has blurry vision, no hearing changes or sore throat Respiratory: no dyspnea, has coughing, no wheezing CV: no chest pain, no palpitations GI: had nausea, no vomiting, abdominal pain, diarrhea, constipation GU: no dysuria, burning on urination,  increased urinary frequency, hematuria  Ext: no leg edema Neuro: no unilateral weakness, numbness, or tingling, no vision change or hearing loss Skin: no rash, no skin tear. MSK: No muscle spasm, no deformity, no limitation of range of movement in spin Heme: No easy bruising.  Travel history: No recent long distant travel.  Allergy: No Known Allergies  Past Medical History:  Diagnosis Date  . Breast cancer ([place]) 1993   left breast  . Diabetes mellitus without complication ([place])   . GERD (gastroesophageal reflux disease)   . HLD (hyperlipidemia)   . Hypertension     Past Surgical History:  Procedure Laterality Date  . MASTECTOMY Left 1993   mastectomy with chemotherapy    Social History:  reports that she has quit smoking. She has never used smokeless tobacco. She reports that she does not drink alcohol and does not use drugs.  Family History:  Family History  Problem Relation Age of Onset  . Breast cancer Other 40       did have BRCA testing and it was negative  . Pancreatic cancer Other 24       two nephews  . Breast cancer Sister 67       2 sisters, ages 65 and 47  . Pancreatic cancer Mother 83     Prior to Admission medications   Not on File    Physical Exam: Vitals:   01/31/21 0943 01/31/21 0945 01/31/21 1003 01/31/21 1030  BP:   (!) 148/79 (!) 145/96  Pulse: (!) 108 (!) 102 100 95  Resp: (!) 21 (!) 23 (!) 22 (!) 21  Temp:      TempSrc:      SpO2: (!) 89% 94% 99% 94%  Weight:      Height:       General: Not in acute distress HEENT:       Eyes: PERRL, EOMI, no scleral icterus.       ENT: No discharge from the ears and nose, no pharynx injection, no tonsillar enlargement.        Neck: No JVD, no bruit, no mass felt. Heme: No neck lymph node enlargement. Cardiac: S1/S2, RRR, No murmurs, No gallops or rubs. Respiratory: No rales, wheezing, rhonchi or rubs. GI: Soft, nondistended, nontender, no rebound pain, no organomegaly, BS present. GU: No  hematuria Ext: No pitting leg edema bilaterally. 1+DP/PT pulse bilaterally. Musculoskeletal: No joint deformities, No joint redness or warmth, no limitation of ROM in spin. Skin: No rashes.  Neuro: Alert, oriented X3, cranial nerves II-XII grossly intact except for homonymous hemianopsia to the right-sided visual field, moves all extremities normally. Muscle strength 5/5 in all extremities, sensation to light touch intact. Psych: Patient is not psychotic, no suicidal or hemocidal ideation.  Labs on Admission: I have personally reviewed following labs and imaging studies  CBC: Recent Labs  Lab 01/31/21 0900  WBC 8.3  NEUTROABS 6.1  HGB 13.9  HCT 44.5  MCV 90.1  PLT 381   Basic Metabolic Panel: Recent Labs  Lab 01/31/21 0900 01/31/21 0929  NA 137  --   K 3.8  --   CL 103  --   CO2 24  --   GLUCOSE 189*  --   BUN 16  --   CREATININE 0.50 0.50  CALCIUM 8.9  --    GFR: Estimated Creatinine Clearance: 77 mL/min (by C-G formula based on SCr of 0.5 mg/dL). Liver Function Tests: Recent Labs  Lab 01/31/21 0900  AST 22  ALT 24  ALKPHOS 81  BILITOT 0.5  PROT 7.7  ALBUMIN 4.1   No results for input(s): LIPASE, AMYLASE in the last 168 hours. No results for input(s): AMMONIA in the last 168 hours. Coagulation Profile: Recent Labs  Lab 01/31/21 0900  INR 1.1   Cardiac Enzymes: No results for input(s): CKTOTAL, CKMB, CKMBINDEX, TROPONINI in the last 168 hours. BNP (last 3 results) No results for input(s): PROBNP in the last 8760 hours. HbA1C: No results for input(s): HGBA1C in the last 72 hours. CBG: Recent Labs  Lab 01/31/21 0904  GLUCAP 179*   Lipid Profile: No results for input(s): CHOL, HDL, LDLCALC, TRIG, CHOLHDL, LDLDIRECT in the last 72 hours. Thyroid Function Tests: No results for input(s): TSH, T4TOTAL, FREET4, T3FREE, THYROIDAB in the last 72 hours. Anemia Panel: No results for input(s): VITAMINB12, FOLATE, FERRITIN, TIBC, IRON, RETICCTPCT in the last  72 hours. Urine analysis:    Component Value Date/Time   COLORURINE STRAW (A) 01/31/2021 0945   APPEARANCEUR CLEAR (A) 01/31/2021 0945   APPEARANCEUR Hazy 01/24/2013 0911   LABSPEC 1.031 (H) 01/31/2021 0945   LABSPEC 1.017 01/24/2013 0911   PHURINE 6.0 01/31/2021 0945   GLUCOSEU 150 (A) 01/31/2021 0945   GLUCOSEU 50 mg/dL 01/24/2013 0911   HGBUR MODERATE (A) 01/31/2021 0945   BILIRUBINUR NEGATIVE 01/31/2021 0945   BILIRUBINUR Negative 01/24/2013 0911   KETONESUR NEGATIVE 01/31/2021 0945   PROTEINUR 30 (A) 01/31/2021 0945   NITRITE NEGATIVE 01/31/2021 0945   LEUKOCYTESUR NEGATIVE 01/31/2021 0945   LEUKOCYTESUR Trace 01/24/2013 0911   Sepsis Labs: @  LABRCNTIP(procalcitonin:4,lacticidven:4) ) Recent Results (from the past 240 hour(s))  Resp Panel by RT-PCR (Flu A&B, Covid) Nasopharyngeal Swab     Status: None   Collection Time: 01/31/21  9:45 AM   Specimen: Nasopharyngeal Swab; Nasopharyngeal(NP) swabs in vial transport medium  Result Value Ref Range Status   SARS Coronavirus 2 by RT PCR NEGATIVE NEGATIVE Final    Comment: (NOTE) SARS-CoV-2 target nucleic acids are NOT DETECTED.  The SARS-CoV-2 RNA is generally detectable in upper respiratory specimens during the acute phase of infection. The lowest concentration of SARS-CoV-2 viral copies this assay can detect is 138 copies/mL. A negative result does not preclude SARS-Cov-2 infection and should not be used as the sole basis for treatment or other patient management decisions. A negative result may occur with  improper specimen collection/handling, submission of specimen other than nasopharyngeal swab, presence of viral mutation(s) within the areas targeted by this assay, and inadequate number of viral copies(<138 copies/mL). A negative result must be combined with clinical observations, patient history, and epidemiological information. The expected result is Negative.  Fact Sheet for Patients:   EntrepreneurPulse.com.au  Fact Sheet for Healthcare Providers:  IncredibleEmployment.be  This test is no t yet approved or cleared by the [place] FDA and  has been authorized for detection and/or diagnosis of SARS-CoV-2 by FDA under an Emergency Use Authorization (EUA). This EUA will remain  in effect (meaning this test can be used) for the duration of the COVID-19 declaration under Section 564(b)(1) of the Act, 21 U.S.C.section 360bbb-3(b)(1), unless the authorization is terminated  or revoked sooner.       Influenza A by PCR NEGATIVE NEGATIVE Final   Influenza B by PCR NEGATIVE NEGATIVE Final    Comment: (NOTE) The Xpert Xpress SARS-CoV-2/FLU/RSV plus assay is intended as an aid in the diagnosis of influenza from Nasopharyngeal swab specimens and should not be used as a sole basis for treatment. Nasal washings and aspirates are unacceptable for Xpert Xpress SARS-CoV-2/FLU/RSV testing.  Fact Sheet for Patients: EntrepreneurPulse.com.au  Fact Sheet for Healthcare Providers: IncredibleEmployment.be  This test is not yet approved or cleared by the [place] FDA and has been authorized for detection and/or diagnosis of SARS-CoV-2 by FDA under an Emergency Use Authorization (EUA). This EUA will remain in effect (meaning this test can be used) for the duration of the COVID-19 declaration under Section 564(b)(1) of the Act, 21 U.S.C. section 360bbb-3(b)(1), unless the authorization is terminated or revoked.  Performed at [facility], [address] [place], [place] [address]      Radiological Exams on Admission: CT [facility] HEAD NECK W WO CONTRAST  Result Date: 01/31/2021 CLINICAL DATA:  Acute stroke EXAM: CT ANGIOGRAPHY HEAD AND NECK TECHNIQUE: Multidetector CT imaging of the head and neck was performed using the standard protocol during bolus administration of intravenous contrast.  Multiplanar CT image reconstructions and MIPs were obtained to evaluate the vascular anatomy. Carotid stenosis measurements (when applicable) are obtained utilizing NASCET criteria, using the distal internal carotid diameter as the denominator. CONTRAST:  31m OMNIPAQUE IOHEXOL 350 MG/ML SOLN COMPARISON:  Head CT from earlier today FINDINGS: CTA NECK FINDINGS Aortic arch: 2 vessel branching.  No acute finding. Right carotid system: Vessels are smooth and widely patent. No noted atheromatous changes. Left carotid system: Vessels are smooth and widely patent. No noted atheromatous changes. Vertebral arteries: No proximal subclavian stenosis. Both vertebral arteries are smoothly contoured and widely patent to the dura Skeleton: Cervical spine degeneration with reversed lordosis. Other neck: Small  calcification in the right lobe thyroid, incidental. No acute finding. Upper chest: Airway thickening, interlobular septal thickening, and ground-glass opacity consistent with pulmonary edema. Review of the MIP images confirms the above findings CTA HEAD FINDINGS Anterior circulation: Mild calcified plaque along the carotid siphons. No branch occlusion, beading, or aneurysm. Posterior circulation: The vertebral arteries are symmetric in size. Widely patent vertebral and basilar arteries. Left PCA branch occlusion correlating with the size of infarct. Negative for aneurysm Venous sinuses: Diffusely patent Anatomic variants: None significant Review of the MIP images confirms the above findings IMPRESSION: 1. 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 2. No underlying stenosis or embolic source is seen in the proximal vessels. Atheromatous changes are mild. 3. Pulmonary edema. Electronically Signed   By: Monte Fantasia M.D.   On: 01/31/2021 10:01   CT HEAD CODE STROKE WO CONTRAST  Result Date: 01/31/2021 CLINICAL DATA:  Code stroke.  Right hemianopsia. EXAM: CT HEAD WITHOUT CONTRAST  TECHNIQUE: Contiguous axial images were obtained from the base of the skull through the vertex without intravenous contrast. COMPARISON:  None. FINDINGS: Brain: Completed infarct in the left occipital lobe which is PCA branch territory and involves the visual cortex. No areas of prior infarct. No hemorrhage. Vascular: No hyperdense vessel or unexpected calcification. Skull: Normal. Negative for fracture or focal lesion. Sinuses/Orbits: No acute finding. Other: These results were called by telephone at the time of interpretation on 01/31/2021 at 9:23 am to provider [employer] , who verbally acknowledged these results. ASPECTS [facility] Stroke Program Early CT Score) Not scored with this history IMPRESSION: Acute infarct in the left occipital lobe. Electronically Signed   By: Monte Fantasia M.D.   On: 01/31/2021 09:24     EKG: I have personally reviewed.  sinus rhythm, QTC 505, LAE, poor R wave progression, atypical left bundle blockage.   Assessment/Plan Principal Problem:   Stroke [facility]) Active Problems:   Diabetes mellitus without complication (HCC)   Hypertension   HLD (hyperlipidemia)   GERD (gastroesophageal reflux disease)  Stroke [facility]):  Ct of head showed acute stroke in the left occipital area.  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  Dr. Leonel Ramsay of neurology is consulted.  - Placed on MedSurg bed for observation - Obtain MRI  - will hold oral Bp meds to allow permissive HTN in the setting of acute stroke  - ASA and plavix per neuro - increase dose of lipitor from 20 to 80 mg daily - fasting lipid panel and HbA1c  - 2D transthoracic echocardiography  - swallowing screen. If fails, will get SLP - Check UDS  - PT/OT consult  Diabetes mellitus without complication ([place]): No H2D on record.  Patient is taking Metformin and Januvia -SSI -check A1c  Hypertension: -Hold oral amlodipine to allow permissive hypertension -Continue Coreg since patient has  tachycardia -As needed hydralazine for SBP> 220 and dBP> 120  HLD: -Lipitor  GERD: -protonix      DVT ppx: SQ Lovenox Code Status: Full code Family Communication:  Yes, patient's daughter by phone Disposition Plan:  Anticipate discharge back to previous environment Consults called: Dr. Leonel Ramsay of neurology Admission status and Level of care: Med-Surg:    Med-surg bed for obs    Status is: Observation  The patient remains OBS appropriate and will d/c before 2 midnights.  Dispo: The patient is from: Home              Anticipated d/c is to: Home  Patient currently is not medically stable to d/c.   Difficult to place patient No            Date of Service 01/31/2021    [facility]   If 7PM-7AM, please contact night-coverage [email or web address] 01/31/2021, 11:00 AM

## 2021-01-31 NOTE — Progress Notes (Signed)
CODE STROKE- PHARMACY COMMUNICATION   Time CODE STROKE called/page received: [id number]  Time response to CODE STROKE was made (in person or via phone): in person  Time Stroke Kit retrieved from [place] (only if needed): N/A  Name of Provider/Nurse contacted: EDP D. Tamala Julian  Past Medical History:  Diagnosis Date  . Breast cancer ([place]) 1993   left breast  . Diabetes mellitus without complication ([place])   . GERD (gastroesophageal reflux disease)   . HLD (hyperlipidemia)   . Hypertension    Prior to Admission medications   Medication Sig Start Date End Date Taking? Authorizing Provider  amLODipine (NORVASC) 10 MG tablet Take 10 mg by mouth daily. 01/26/21  Yes [provider]  aspirin EC 81 MG tablet Take 81 mg by mouth daily. Swallow whole.   Yes [provider]  atorvastatin (LIPITOR) 20 MG tablet Take 20 mg by mouth daily. 12/15/20  Yes [provider]  carvedilol (COREG) 25 MG tablet Take 25 mg by mouth 2 (two) times daily. 01/15/21  Yes [provider]  esomeprazole (NEXIUM) 40 MG capsule Take 40 mg by mouth daily. 11/30/20  Yes [provider]  JANUVIA 100 MG tablet Take 100 mg by mouth daily. 11/19/20  Yes [provider]  latanoprost (XALATAN) 0.005 % ophthalmic solution Place 1 drop into both eyes at bedtime. 10/22/20  Yes [provider]  metFORMIN (GLUCOPHAGE) 500 MG tablet Take 500 mg by mouth 2 (two) times daily with a meal.   Yes [provider]  timolol (TIMOPTIC) 0.5 % ophthalmic solution Place 1 drop into both eyes daily. 09/01/20  Yes [provider]    Benn Moulder, PharmD Pharmacy Resident  01/31/2021 11:14 AM

## 2021-01-31 NOTE — ED Triage Notes (Signed)
Pt states HA that started last night, then awoke this morning with visual changes to R eye, pt states she can see but she can't see the way she's supposed to be seeing.   Pt states symptoms started at 3/27 at midnight.   Pt also c/o nausea that started while riding in the car.   Pt also states hx of glaucoma.

## 2021-02-01 DIAGNOSIS — I63432 Cerebral infarction due to embolism of left posterior cerebral artery: Secondary | ICD-10-CM | POA: Diagnosis not present

## 2021-02-01 DIAGNOSIS — I639 Cerebral infarction, unspecified: Secondary | ICD-10-CM | POA: Diagnosis not present

## 2021-02-01 LAB — LIPID PANEL
Cholesterol: 128 mg/dL (ref 0–200)
HDL: 53 mg/dL (ref >40)
LDL Cholesterol: 69 mg/dL (ref 0–99)
Total CHOL/HDL Ratio: 2.4 RATIO
Triglycerides: 32 mg/dL (ref <150)
VLDL: 6 mg/dL (ref 0–40)

## 2021-02-01 LAB — GLUCOSE, CAPILLARY
Glucose-Capillary: 118 mg/dL — ABNORMAL HIGH (ref 70–99)
Glucose-Capillary: 203 mg/dL — ABNORMAL HIGH (ref 70–99)
Glucose-Capillary: 164 mg/dL — ABNORMAL HIGH (ref 70–99)
Glucose-Capillary: 189 mg/dL — ABNORMAL HIGH (ref 70–99)

## 2021-02-01 LAB — HIV ANTIBODY (ROUTINE TESTING W REFLEX): HIV Screen 4th Generation wRfx: NONREACTIVE

## 2021-02-01 LAB — HEMOGLOBIN A1C
Hgb A1c MFr Bld: 7.4 % — ABNORMAL HIGH (ref 4.8–5.6)
Mean Plasma Glucose: 165.68 mg/dL

## 2021-02-01 MED ORDER — ATORVASTATIN CALCIUM 20 MG PO TABS
40.0000 mg | ORAL_TABLET | Freq: Every day | ORAL | Status: DC
  Administered 2021-02-02 – 2021-02-03 (×2): 40 mg via ORAL
  Filled 2021-02-01 (×2): qty 2

## 2021-02-01 MED ORDER — ENOXAPARIN SODIUM 60 MG/0.6ML ~~LOC~~ SOLN
0.5000 mg/kg | SUBCUTANEOUS | Status: DC
  Administered 2021-02-01 – 2021-02-03 (×3): 42.5 mg via SUBCUTANEOUS
  Filled 2021-02-01 (×3): qty 0.6

## 2021-02-01 NOTE — Plan of Care (Signed)
Pt denies pain. NIH 1 due to right eye has blurred vision. Pt reports glaucoma previously prior to stroke in right eye. Will continue to monitor.  Problem: Education: Goal: Knowledge of secondary prevention will improve Outcome: Progressing Goal: Knowledge of patient specific risk factors addressed and post discharge goals established will improve Outcome: Progressing   Problem: Ischemic Stroke/TIA Tissue Perfusion: Goal: Complications of ischemic stroke/TIA will be minimized Outcome: Progressing   Problem: Education: Goal: Knowledge of General Education information will improve Description: Including pain rating scale, medication(s)/side effects and non-pharmacologic comfort measures Outcome: Progressing   Problem: Clinical Measurements: Goal: Ability to maintain clinical measurements within normal limits will improve Outcome: Progressing   Problem: Pain Managment: Goal: General experience of comfort will improve Outcome: Progressing   Problem: Safety: Goal: Ability to remain free from injury will improve Outcome: Progressing

## 2021-02-01 NOTE — Progress Notes (Signed)
SLP Cancellation Note  Patient Details Name: [name] MRN: [id number] DOB: 01/14/1956   Cancelled treatment:       Reason Eval/Treat Not Completed: SLP screened, no needs identified, will sign off  Chart reviewed and pt unable to identify any acute cognitive deficits at this time. If any concerns arise, pt may contact her PCP for referral to Outpatient ST.   Gertrude Bucks B. Rutherford Nail M.S., CCC-SLP, [place] Office [phone]   Stormy Fabian 02/01/2021, 12:52 PM

## 2021-02-01 NOTE — Progress Notes (Signed)
PROGRESS NOTE    Janice Richmond  FTD:[id number] DOB: 1955/12/18 DOA: 01/31/2021 PCP: Marguerita Merles, MD   Brief Narrative:  65 y.o. female with medical history significant of hypertension, HLD, GERD, diabetes mellitus, left breast cancer 1993 (s/p for mastectomy and chemotherapy), who presents with blurry vision and headache.  Patient states that she suddenly started having headache when she was watching television at midnight.  Then she developed blurred vision in the right eye.  The headache has improved, but she has persistent blurry vision in the right eye.  Denies unilateral numbness or tingling in extremities, no facial droop or slurred speech.  Ct of head showed acute stroke in the left occipital area.  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  Neurology consulted, recommendations appreciated.  Follow-up MRI significant for large PCA territory infarct.  Patient started on dual antiplatelet therapy aspirin Plavix after 300 mg Plavix load.  Assessment & Plan:   Principal Problem:   Stroke [facility]) Active Problems:   Diabetes mellitus without complication ([place])   Hypertension   HLD (hyperlipidemia)   GERD (gastroesophageal reflux disease)  Stroke (HCC)   Ct of head showed acute stroke in the left occipital area.   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 MRI brain demonstrates large PCA territory infarct Neurology consulted Patient symptoms seem to be improving over interval Plan: Continue dual antiplatelet therapy aspirin Plavix Blood pressure control, currently well controlled High intensity statin, LDL 69 the dose increased to 40 mg Lipitor daily Follow-up hemoglobin A1c, currently pending 2D echocardiogram, pending Therapy evaluations SLP consult Follow-up neurology recommendations  Diabetes mellitus without complication ([place])  No C6C on record.  Patient is taking Metformin and Januvia Oral agents on hold Check A1c, pending Sliding  scale coverage  Hypertension -Blood pressure currently well controlled On amlodipine on hold Coreg resumed due to tachycardia As needed hydralazine  HLD: -Lipitor, dose increased to 40 mg daily  GERD: -protonix  DVT prophylaxis: Lovenox Code Status: Full Family Communication: None today.  Offered to call but patient declined Disposition Plan: Status is: Observation  The patient will require care spanning > 2 midnights and should be moved to inpatient because: Inpatient level of care appropriate due to severity of illness  Dispo: The patient is from: Home              Anticipated d/c is to: Home              Patient currently is not medically stable to d/c.   Difficult to place patient No   Pending completion of stroke work-up including 2D echocardiogram and neurology follow-up         Level of care: Med-Surg  Consultants:   Neurology  Procedures:   None  Antimicrobials:   None   Subjective: Patient seen and examined.  Sitting up in bed.  No visible distress.  Does endorse some mild loss of vision on the right  Objective: Vitals:   01/31/21 2118 02/01/21 0128 02/01/21 0401 02/01/21 0747  BP: 136/88 117/72 130/78 128/80  Pulse: 88 72 70 77  Resp: 16 16 16 16   Temp: 98.9 F (37.2 C) 99 F (37.2 C) 97.9 F (36.6 C) 98.9 F (37.2 C)  TempSrc:  Oral Oral Oral  SpO2: 100% 97% 98% 100%  Weight:      Height:        Intake/Output Summary (Last 24 hours) at 02/01/2021 1007 Last data filed at 02/01/2021 1002 Gross per 24  hour  Intake 440 ml  Output --  Net 440 ml   Filed Weights   01/31/21 0847 01/31/21 0942  Weight: 81.6 kg 83.9 kg    Examination:  General exam: Appears calm and comfortable  Respiratory system: Clear to auscultation. Respiratory effort normal. Cardiovascular system: S1 & S2 heard, RRR. No JVD, murmurs, rubs, gallops or clicks. No pedal edema. Gastrointestinal system: Abdomen is nondistended, soft and nontender. No  organomegaly or masses felt. Normal bowel sounds heard. Central nervous system: Alert and oriented.  Right lateral visual loss Extremities: Symmetric 5 x 5 power. Skin: No rashes, lesions or ulcers Psychiatry: Judgement and insight appear normal. Mood & affect appropriate.     Data Reviewed: I have personally reviewed following labs and imaging studies  CBC: Recent Labs  Lab 01/31/21 0900  WBC 8.3  NEUTROABS 6.1  HGB 13.9  HCT 44.5  MCV 90.1  PLT 536   Basic Metabolic Panel: Recent Labs  Lab 01/31/21 0900 01/31/21 0929  NA 137  --   K 3.8  --   CL 103  --   CO2 24  --   GLUCOSE 189*  --   BUN 16  --   CREATININE 0.50 0.50  CALCIUM 8.9  --    GFR: Estimated Creatinine Clearance: 77 mL/min (by C-G formula based on SCr of 0.5 mg/dL). Liver Function Tests: Recent Labs  Lab 01/31/21 0900  AST 22  ALT 24  ALKPHOS 81  BILITOT 0.5  PROT 7.7  ALBUMIN 4.1   No results for input(s): LIPASE, AMYLASE in the last 168 hours. No results for input(s): AMMONIA in the last 168 hours. Coagulation Profile: Recent Labs  Lab 01/31/21 0900  INR 1.1   Cardiac Enzymes: No results for input(s): CKTOTAL, CKMB, CKMBINDEX, TROPONINI in the last 168 hours. BNP (last 3 results) No results for input(s): PROBNP in the last 8760 hours. HbA1C: No results for input(s): HGBA1C in the last 72 hours. CBG: Recent Labs  Lab 01/31/21 0904 01/31/21 1138 01/31/21 1614 01/31/21 2209 02/01/21 0749  GLUCAP 179* 179* 185* 169* 118*   Lipid Profile: Recent Labs    02/01/21 0257  CHOL 128  HDL 53  LDLCALC 69  TRIG 32  CHOLHDL 2.4   Thyroid Function Tests: No results for input(s): TSH, T4TOTAL, FREET4, T3FREE, THYROIDAB in the last 72 hours. Anemia Panel: No results for input(s): VITAMINB12, FOLATE, FERRITIN, TIBC, IRON, RETICCTPCT in the last 72 hours. Sepsis Labs: No results for input(s): PROCALCITON, LATICACIDVEN in the last 168 hours.  Recent Results (from the past 240  hour(s))  Resp Panel by RT-PCR (Flu A&B, Covid) Nasopharyngeal Swab     Status: None   Collection Time: 01/31/21  9:45 AM   Specimen: Nasopharyngeal Swab; Nasopharyngeal(NP) swabs in vial transport medium  Result Value Ref Range Status   SARS Coronavirus 2 by RT PCR NEGATIVE NEGATIVE Final    Comment: (NOTE) SARS-CoV-2 target nucleic acids are NOT DETECTED.  The SARS-CoV-2 RNA is generally detectable in upper respiratory specimens during the acute phase of infection. The lowest concentration of SARS-CoV-2 viral copies this assay can detect is 138 copies/mL. A negative result does not preclude SARS-Cov-2 infection and should not be used as the sole basis for treatment or other patient management decisions. A negative result may occur with  improper specimen collection/handling, submission of specimen other than nasopharyngeal swab, presence of viral mutation(s) within the areas targeted by this assay, and inadequate number of viral copies(<138 copies/mL). A negative result must  be combined with clinical observations, patient history, and epidemiological information. The expected result is Negative.  Fact Sheet for Patients:  EntrepreneurPulse.com.au  Fact Sheet for Healthcare Providers:  IncredibleEmployment.be  This test is no t yet approved or cleared by the [place] FDA and  has been authorized for detection and/or diagnosis of SARS-CoV-2 by FDA under an Emergency Use Authorization (EUA). This EUA will remain  in effect (meaning this test can be used) for the duration of the COVID-19 declaration under Section 564(b)(1) of the Act, 21 U.S.C.section 360bbb-3(b)(1), unless the authorization is terminated  or revoked sooner.       Influenza A by PCR NEGATIVE NEGATIVE Final   Influenza B by PCR NEGATIVE NEGATIVE Final    Comment: (NOTE) The Xpert Xpress SARS-CoV-2/FLU/RSV plus assay is intended as an aid in the diagnosis of influenza from  Nasopharyngeal swab specimens and should not be used as a sole basis for treatment. Nasal washings and aspirates are unacceptable for Xpert Xpress SARS-CoV-2/FLU/RSV testing.  Fact Sheet for Patients: EntrepreneurPulse.com.au  Fact Sheet for Healthcare Providers: IncredibleEmployment.be  This test is not yet approved or cleared by the [place] FDA and has been authorized for detection and/or diagnosis of SARS-CoV-2 by FDA under an Emergency Use Authorization (EUA). This EUA will remain in effect (meaning this test can be used) for the duration of the COVID-19 declaration under Section 564(b)(1) of the Act, 21 U.S.C. section 360bbb-3(b)(1), unless the authorization is terminated or revoked.  Performed at [facility], [address] [place], [place] [address]          Radiology Studies: CT [facility] HEAD NECK W WO CONTRAST  Result Date: 01/31/2021 CLINICAL DATA:  Acute stroke EXAM: CT ANGIOGRAPHY HEAD AND NECK TECHNIQUE: Multidetector CT imaging of the head and neck was performed using the standard protocol during bolus administration of intravenous contrast. Multiplanar CT image reconstructions and MIPs were obtained to evaluate the vascular anatomy. Carotid stenosis measurements (when applicable) are obtained utilizing NASCET criteria, using the distal internal carotid diameter as the denominator. CONTRAST:  27mL OMNIPAQUE IOHEXOL 350 MG/ML SOLN COMPARISON:  Head CT from earlier today FINDINGS: CTA NECK FINDINGS Aortic arch: 2 vessel branching.  No acute finding. Right carotid system: Vessels are smooth and widely patent. No noted atheromatous changes. Left carotid system: Vessels are smooth and widely patent. No noted atheromatous changes. Vertebral arteries: No proximal subclavian stenosis. Both vertebral arteries are smoothly contoured and widely patent to the dura Skeleton: Cervical spine degeneration with reversed lordosis. Other  neck: Small calcification in the right lobe thyroid, incidental. No acute finding. Upper chest: Airway thickening, interlobular septal thickening, and ground-glass opacity consistent with pulmonary edema. Review of the MIP images confirms the above findings CTA HEAD FINDINGS Anterior circulation: Mild calcified plaque along the carotid siphons. No branch occlusion, beading, or aneurysm. Posterior circulation: The vertebral arteries are symmetric in size. Widely patent vertebral and basilar arteries. Left PCA branch occlusion correlating with the size of infarct. Negative for aneurysm Venous sinuses: Diffusely patent Anatomic variants: None significant Review of the MIP images confirms the above findings IMPRESSION: 1. 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 2. No underlying stenosis or embolic source is seen in the proximal vessels. Atheromatous changes are mild. 3. Pulmonary edema. Electronically Signed   By: Monte Fantasia M.D.   On: 01/31/2021 10:01   MR BRAIN WO CONTRAST  Result Date: 01/31/2021 CLINICAL DATA:  Left PCA stroke on CT EXAM: MRI HEAD WITHOUT  CONTRAST TECHNIQUE: Multiplanar, multiecho pulse sequences of the brain and surrounding structures were obtained without intravenous contrast. COMPARISON:  None. FINDINGS: Brain: There is restricted diffusion in the left occipital lobe extending into the posterior temporal lobe reflecting known PCA territory infarction. No evidence of hemorrhagic transformation. No significant mass effect. Additional patchy foci of T2 hyperintensity in the supratentorial white matter are nonspecific but may reflect minor chronic microvascular ischemic changes. Ventricles and sulci are within normal limits in size and configuration. There is no intracranial mass. There is no hydrocephalus or extra-axial fluid collection. Vascular: Major vessel flow voids at the skull base are preserved. Skull and upper cervical spine: Normal marrow  signal is preserved. Sinuses/Orbits: Paranasal sinuses are aerated. Orbits are unremarkable. Other: Sella is unremarkable.  Mastoid air cells are clear. IMPRESSION: Acute large left PCA territory infarction. No hemorrhage or significant mass effect. Minor chronic microvascular ischemic changes. Electronically Signed   By: Macy Mis M.D.   On: 01/31/2021 14:17   CT HEAD CODE STROKE WO CONTRAST  Result Date: 01/31/2021 CLINICAL DATA:  Code stroke.  Right hemianopsia. EXAM: CT HEAD WITHOUT CONTRAST TECHNIQUE: Contiguous axial images were obtained from the base of the skull through the vertex without intravenous contrast. COMPARISON:  None. FINDINGS: Brain: Completed infarct in the left occipital lobe which is PCA branch territory and involves the visual cortex. No areas of prior infarct. No hemorrhage. Vascular: No hyperdense vessel or unexpected calcification. Skull: Normal. Negative for fracture or focal lesion. Sinuses/Orbits: No acute finding. Other: These results were called by telephone at the time of interpretation on 01/31/2021 at 9:23 am to provider [facility] , who verbally acknowledged these results. ASPECTS [facility] Stroke [facility] Early CT Score) Not scored with this history IMPRESSION: Acute infarct in the left occipital lobe. Electronically Signed   By: Monte Fantasia M.D.   On: 01/31/2021 09:24        Scheduled Meds: . aspirin EC  81 mg Oral Daily  . atorvastatin  80 mg Oral Daily  . carvedilol  25 mg Oral BID  . clopidogrel  75 mg Oral Daily  . enoxaparin (LOVENOX) injection  0.5 mg/kg Subcutaneous Q24H  . insulin aspart  0-5 Units Subcutaneous QHS  . insulin aspart  0-9 Units Subcutaneous TID WC  . latanoprost  1 drop Both Eyes QHS  . pantoprazole  40 mg Oral Daily  . timolol  1 drop Both Eyes Daily   Continuous Infusions:   LOS: 0 days    Time spent: 25 minutes    Sidney Ace, MD [facility] Pager [phone]  If 7PM-7AM, please contact  night-coverage 02/01/2021, 10:07 AM

## 2021-02-01 NOTE — Progress Notes (Signed)
Neurology Progress Note  Patient ID: Janice Richmond is a 65 y.o. with PMHx of  has a past medical history of Breast cancer ([place]) (1993), Diabetes mellitus without complication ([place]), GERD (gastroesophageal reflux disease), HLD (hyperlipidemia), and Hypertension. who presented with a left PCA infarct for which neurology was consulted    Subjective: Stable visual deficit No complaints of headache No new neurological complaints  Exam: Vitals:   02/01/21 0401 02/01/21 0747  BP: 130/78 128/80  Pulse: 70 77  Resp: 16 16  Temp: 97.9 F (36.6 C) 98.9 F (37.2 C)  SpO2: 98% 100%   Gen: In bed, comfortable  Resp: non-labored breathing, no grossly audible wheezing Cardiac: Perfusing extremities well  Abd: soft, nt  Neuro: MS: Awake, alert, oriented to situation, month, year, place CN: Stable right hemianopia, EOMI, face symmetric, tongue midline Motor: 5/5 throughout Sensory: Intact to light touch in all 4 extremities  Pertinent Workup:  Lab Results  Component Value Date   CHOL 128 02/01/2021   HDL 53 02/01/2021   LDLCALC 69 02/01/2021   TRIG 32 02/01/2021   CHOLHDL 2.4 02/01/2021   Lab Results  Component Value Date   HGBA1C 7.4 (H) 02/01/2021     MRI brain personally reviewed -- isolated L PCA infarct without any other subclinical strokes identified. No hemorrhage   CTA personally reviewed -- L PCA branch occlusion correlating to infarct without significant atherosclerosis or stenoses   Impression: Left PCA embolic stroke of unknown source at this time.   Recommendations: - BP goal normotension - Echocardiogram read pending, neurology will follow up this study - Prophylactic therapy- asa 81mg  daily and plavix 75mg  daily after 300mg  load.   - Continue DAPT for 21 days, then aspirin monotherapy life long - Risk factor modification - Telemetry monitoring - Oncology follow-up discussed with patient, she is due for her next mammogram soon but denies any B symptoms at  this time other than night sweats which she associates with menopause - PT consult, OT consult completed with outpatient Holcomb MD-PhD [facility] [phone]

## 2021-02-01 NOTE — Progress Notes (Addendum)
Occupational Therapy Treatment Patient Details Name: Janice Richmond MRN: [id number] DOB: 1955-11-11 Today's Date: 02/01/2021    History of present illness Pt is a 65 y/o F admitted from home on 01/31/21 with c/c of HA & blurry vision. CT of head showed acute stroke in L occipital area. 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 PMH: HTN, HLD, GERD, DM, L breast CA 1993 (s/p mastectomy & chemotherapy)   OT comments  Pt seen for OT tx this date. Pt received standing at sink preparing to brush her teeth. Pt performed grooming tasks in standing and sponge bath in standing/sitting with LB dressing in sitting with supervision and PRN verbal cues to attend to R side to obtain items needed for ADL. Pt instructed in visual scanning exercise with instruction in visual compensatory strategies for reading and strategies to support safety/independence in highly visually stimulating environments like the grocery store. Pt verbalized understanding. Pt demo'd intermittent word finding difficulty, pausing and approximating words (e.g., ladder for stool). Pt continues to benefit from skilled OT services. OP OT continues to be appropriate.    Follow Up Recommendations  Outpatient OT;Supervision - Intermittent    Equipment Recommendations  None recommended by OT    Recommendations for Other Services      Precautions / Restrictions Precautions Precautions: Fall Precaution Comments: R visual field deficits Restrictions Weight Bearing Restrictions: No       Mobility Bed Mobility Overal bed mobility: Independent                  Transfers Overall transfer level: Independent Equipment used: None Transfers: Sit to/from Stand Sit to Stand: Independent              Balance Overall balance assessment: Needs assistance Sitting-balance support: Feet supported Sitting balance-Leahy Scale: Normal     Standing balance support: During functional activity;No upper extremity  supported Standing balance-Leahy Scale: Good                             ADL either performed or assessed with clinical judgement   ADL                                         General ADL Comments: Supervision and PRN VC to attend to R side for standing grooming tasks at sink and for standing/sitting sponge bathing, LB dressing     Vision Baseline Vision/History: Wears glasses Wears Glasses: At all times Patient Visual Report: Blurring of vision Vision Assessment?: Yes;Vision impaired- to be further tested in functional context Visual Fields: Right visual field deficit   Perception     Praxis      Cognition Arousal/Alertness: Awake/alert Behavior During Therapy: WFL for tasks assessed/performed Overall Cognitive Status: Within Functional Limits for tasks assessed                                 General Comments: Very pleasant lady        Exercises Other Exercises Other Exercises: Supervision and PRN VC to attend to R side for standing grooming tasks at sink and for standing/sitting sponge bathing, LB dressing Other Exercises: Pt instructed in visual scanning exercise with instruction in visual compensatory strategies for reading and in highly visually stimulating environments like the grocery store  Shoulder Instructions       General Comments      Pertinent Vitals/ Pain       Pain Assessment: No/denies pain  Home Living                                          Prior Functioning/Environment              Frequency  Min 2X/week        Progress Toward Goals  OT Goals(current goals can now be found in the care plan section)  Progress towards OT goals: Progressing toward goals  Acute Rehab OT Goals Patient Stated Goal: to go home and get better OT Goal Formulation: With patient Time For Goal Achievement: 02/14/21 Potential to Achieve Goals: [place] Discharge plan remains  appropriate;Frequency remains appropriate    Co-evaluation                 AM-PAC OT "6 Clicks" Daily Activity     Outcome Measure   Help from another person eating meals?: None Help from another person taking care of personal grooming?: None Help from another person toileting, which includes using toliet, bedpan, or urinal?: A Little Help from another person bathing (including washing, rinsing, drying)?: A Little Help from another person to put on and taking off regular upper body clothing?: None Help from another person to put on and taking off regular lower body clothing?: A Little 6 Click Score: 21    End of Session    OT Visit Diagnosis: Unsteadiness on feet (R26.81);Low vision, both eyes (H54.2)   Activity Tolerance Patient tolerated treatment well   Patient Left in bed;with call bell/phone within reach   Nurse Communication          Time: 0933-1000 OT Time Calculation (min): 27 min  Charges: OT General Charges $OT Visit: 1 Visit OT Treatments $Self Care/Home Management : 8-22 mins $Neuromuscular Re-education: 8-22 mins  Hanley Hays, MPH, MS, OTR/L ascom [phone] 02/01/21, 12:29 PM

## 2021-02-01 NOTE — Progress Notes (Signed)
Physical Therapy Treatment Patient Details Name: Janice Richmond MRN: [id number] DOB: [birth year] Today's Date: 02/01/2021    History of Present Illness Pt is a 65 y/o F admitted from home on 01/31/21 with c/c of HA & blurry vision. CT of head showed acute stroke in L occipital area. 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 PMH: HTN, HLD, GERD, DM, L breast CA 1993 (s/p mastectomy & chemotherapy)    PT Comments    Pt seen for PT tx with focus on high level balance during gait. Pt ambulates a total of 4 laps around the nurses station with less lateral instability on this date, mod I without AD. Pt engages in head turns & changes in gait speed upon command with close supervision/CGA but no overt LOB. Pt does repeatedly look left when instructed to turn head to R, question if this is decreased attention/visual issues 2/2 stroke. Educated pt on safety with mobility & observation of repeatedly looking L vs R.     Follow Up Recommendations  Outpatient PT     Equipment Recommendations  None recommended by PT    Recommendations for Other Services       Precautions / Restrictions Precautions Precautions: Fall Precaution Comments: R visual field deficits Restrictions Weight Bearing Restrictions: No    Mobility  Bed Mobility Overal bed mobility: Independent                  Transfers Overall transfer level: Independent Equipment used: None Transfers: Sit to/from Stand Sit to Stand: Independent            Ambulation/Gait Ambulation/Gait assistance: Modified independent (Device/Increase time) Gait Distance (Feet): 750 Feet Assistive device: None Gait Pattern/deviations: Step-through pattern Gait velocity: slightly decreased       Stairs             Wheelchair Mobility    Modified Rankin (Stroke Patients Only)       Balance Overall balance assessment: Needs assistance Sitting-balance support: Feet supported Sitting balance-Leahy Scale:  Normal     Standing balance support: During functional activity Standing balance-Leahy Scale: Good                              Cognition Arousal/Alertness: Awake/alert Behavior During Therapy: WFL for tasks assessed/performed Overall Cognitive Status: Within Functional Limits for tasks assessed                                 General Comments: Very pleasant lady      Exercises      General Comments        Pertinent Vitals/Pain Pain Assessment: No/denies pain    Home Living                      Prior Function            PT Goals (current goals can now be found in the care plan section) Acute Rehab PT Goals Patient Stated Goal: to go home and get better PT Goal Formulation: With patient Time For Goal Achievement: 02/14/21 Potential to Achieve Goals: Good Progress towards PT goals: Progressing toward goals    Frequency    7X/week      PT Plan Current plan remains appropriate    Co-evaluation              AM-PAC PT "6  Clicks" Mobility   Outcome Measure  Help needed turning from your back to your side while in a flat bed without using bedrails?: None Help needed moving from lying on your back to sitting on the side of a flat bed without using bedrails?: None Help needed moving to and from a bed to a chair (including a wheelchair)?: None Help needed standing up from a chair using your arms (e.g., wheelchair or bedside chair)?: None Help needed to walk in hospital room?: None Help needed climbing 3-5 steps with a railing? : A Little 6 Click Score: 23    End of Session Equipment Utilized During Treatment: Gait belt Activity Tolerance: Patient tolerated treatment well Patient left: in bed;with call bell/phone within reach;with bed alarm set   PT Visit Diagnosis: Unsteadiness on feet (R26.81)     Time: 1020-1030 PT Time Calculation (min) (ACUTE ONLY): 10 min  Charges:  $Gait Training: 8-22 mins                      Lavone Nian, PT, DPT 02/01/21, 10:34 AM    Waunita Schooner 02/01/2021, 10:33 AM

## 2021-02-02 ENCOUNTER — Observation Stay
Admit: 2021-02-02 | Discharge: 2021-02-02 | Disposition: A | Discharge status: Home / Self Care | Payer: BC Managed Care – PPO | Attending: Internal Medicine | Admitting: Internal Medicine

## 2021-02-02 DIAGNOSIS — I63432 Cerebral infarction due to embolism of left posterior cerebral artery: Secondary | ICD-10-CM | POA: Diagnosis not present

## 2021-02-02 LAB — ECHOCARDIOGRAM COMPLETE
AR max vel: 1.62 cm2
AV Area VTI: 1.41 cm2
AV Area mean vel: 1.3 cm2
AV Mean grad: 2 mmHg
AV Peak grad: 3.7 mmHg
Ao pk vel: 0.96 m/s
Area-P 1/2: 8.16 cm2
Calc EF: 9.7 %
Height: 65 in
S' Lateral: 4.53 cm
Single Plane A2C EF: 8.6 %
Single Plane A4C EF: 13.9 %
Weight: 2960 oz

## 2021-02-02 LAB — GLUCOSE, CAPILLARY
Glucose-Capillary: 172 mg/dL — ABNORMAL HIGH (ref 70–99)
Glucose-Capillary: 193 mg/dL — ABNORMAL HIGH (ref 70–99)
Glucose-Capillary: 155 mg/dL — ABNORMAL HIGH (ref 70–99)
Glucose-Capillary: 148 mg/dL — ABNORMAL HIGH (ref 70–99)

## 2021-02-02 NOTE — Plan of Care (Signed)
NIH remains 1. Pt alert and oriented x 4. Denies pain.  Problem: Education: Goal: Knowledge of secondary prevention will improve Outcome: Progressing Goal: Knowledge of patient specific risk factors addressed and post discharge goals established will improve Outcome: Progressing   Problem: Ischemic Stroke/TIA Tissue Perfusion: Goal: Complications of ischemic stroke/TIA will be minimized Outcome: Progressing   Problem: Education: Goal: Knowledge of General Education information will improve Description: Including pain rating scale, medication(s)/side effects and non-pharmacologic comfort measures Outcome: Progressing   Problem: Clinical Measurements: Goal: Ability to maintain clinical measurements within normal limits will improve Outcome: Progressing   Problem: Pain Managment: Goal: General experience of comfort will improve Outcome: Progressing   Problem: Safety: Goal: Ability to remain free from injury will improve Outcome: Progressing

## 2021-02-02 NOTE — Progress Notes (Signed)
*  PRELIMINARY RESULTS* Echocardiogram 2D Echocardiogram has been performed.  Sherrie Sport 02/02/2021, 10:21 AM

## 2021-02-02 NOTE — Progress Notes (Signed)
Physical Therapy Treatment Patient Details Name: Janice Richmond MRN: [id number] DOB: 1955-12-28 Today's Date: 02/02/2021    History of Present Illness Pt is a 65 y/o F admitted from home on 01/31/21 with c/c of HA & blurry vision. CT of head showed acute stroke in L occipital area. 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 PMH: HTN, HLD, GERD, DM, L breast CA 1993 (s/p mastectomy & chemotherapy)    PT Comments    Pt was long sitting in bed on the phone upon arriving. She is extremely pleasant and agreeable to session. Easily able to exit bed, stand and ambulate. Higher level balance deficits come to light during dynamic gait activities. She would benefit from OP PT to address these balance deficits while assisting pt to PLOF. Pt is agreeable. She was seated in recliner post session with call bell in reach and breakfast tray placed in front of her. Will continue to follow until DC.    Follow Up Recommendations  Outpatient PT     Equipment Recommendations  None recommended by PT       Precautions / Restrictions Precautions Precautions: Fall Precaution Comments: R visual field deficits Restrictions Weight Bearing Restrictions: No    Mobility  Bed Mobility Overal bed mobility: Independent       Transfers Overall transfer level: Independent Equipment used: None Transfers: Sit to/from Stand Sit to Stand: Independent         General transfer comment: pt demonstrates safe ability to STS from bed and recliner  Ambulation/Gait Ambulation/Gait assistance: Modified independent (Device/Increase time) Gait Distance (Feet): 1000 Feet Assistive device: None Gait Pattern/deviations: Step-through pattern Gait velocity: WNL   General Gait Details: gait training focused on dynamic balance exercises. Walking on toes, heel walking, head turns L/R, up/down ect. no LOB however pt is unsteady with higher level balance exercises     Balance Overall balance assessment: Needs  assistance Sitting-balance support: Feet supported Sitting balance-Leahy Scale: Normal     Standing balance support: During functional activity;No upper extremity supported Standing balance-Leahy Scale: Good         Cognition Arousal/Alertness: Awake/alert Behavior During Therapy: WFL for tasks assessed/performed Overall Cognitive Status: Within Functional Limits for tasks assessed          General Comments: Pt is A and O x 4 and extremely pleasant             Pertinent Vitals/Pain Pain Assessment: No/denies pain           PT Goals (current goals can now be found in the care plan section) Acute Rehab PT Goals Patient Stated Goal: to go home and get better Progress towards PT goals: Progressing toward goals    Frequency    7X/week      PT Plan Current plan remains appropriate       AM-PAC PT "6 Clicks" Mobility   Outcome Measure  Help needed turning from your back to your side while in a flat bed without using bedrails?: None Help needed moving from lying on your back to sitting on the side of a flat bed without using bedrails?: None Help needed moving to and from a bed to a chair (including a wheelchair)?: None Help needed standing up from a chair using your arms (e.g., wheelchair or bedside chair)?: None Help needed to walk in hospital room?: None Help needed climbing 3-5 steps with a railing? : A Little 6 Click Score: 23    End of Session Equipment Utilized During Treatment:  Gait belt Activity Tolerance: Patient tolerated treatment well Patient left: in bed;with call bell/phone within reach;with bed alarm set Nurse Communication: Mobility status PT Visit Diagnosis: Unsteadiness on feet (R26.81)     Time: 3167-4255 PT Time Calculation (min) (ACUTE ONLY): 14 min  Charges:  $Gait Training: 8-22 mins                     Julaine Fusi PTA 02/02/21, 9:42 AM

## 2021-02-02 NOTE — Progress Notes (Signed)
PROGRESS NOTE    Janice Richmond  IRC:[id number] DOB: [birth year] DOA: 01/31/2021 PCP: Marguerita Merles, MD   Brief Narrative:  65 y.o. female with medical history significant of hypertension, HLD, GERD, diabetes mellitus, left breast cancer 1993 (s/p for mastectomy and chemotherapy), who presents with blurry vision and headache.  Patient states that she suddenly started having headache when she was watching television at midnight.  Then she developed blurred vision in the right eye.  The headache has improved, but she has persistent blurry vision in the right eye.  Denies unilateral numbness or tingling in extremities, no facial droop or slurred speech.  Ct of head showed acute stroke in the left occipital area.  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  Neurology consulted, recommendations appreciated.  Follow-up MRI significant for large PCA territory infarct.  Patient started on dual antiplatelet therapy aspirin Plavix after 300 mg Plavix load.  Echocardiogram performed that revealed a new onset systolic congestive heart failure with EF 25 to 30%.  Cardiology consulted.  Recommendations appreciated   Assessment & Plan:   Principal Problem:   Stroke [facility]) Active Problems:   Diabetes mellitus without complication ([place])   Hypertension   HLD (hyperlipidemia)   GERD (gastroesophageal reflux disease)  Stroke (HCC) Ct of head showed acute stroke in the left occipital area.   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 MRI brain demonstrates large PCA territory infarct Neurology consulted Patient symptoms seem to be improving over interval Hemoglobin A1c 7.4 Plan: Continue dual antiplatelet therapy aspirin Plavix Blood pressure control, currently well controlled High intensity statin, LDL 69 the dose increased to 40 mg Lipitor daily Therapy evaluations, recommend outpatient PT OT Follow-up neurology recommendations Cardiology consulted given new onset heart  failure  Newly diagnosed heart failure with reduced ejection fraction, EF 25 to 30% Patient with no known history of heart failure Denies any symptoms such as lower extremity edema, shortness of breath, dyspnea on exertion, orthopnea Reduced ejection fraction raises possibility of cardioembolic source of stroke Plan: Cardiology consulted, message sent to [facility], recommendations appreciated Continue telemetry monitoring  Diabetes mellitus without complication (HCC) P1W 7.4.  Patient is taking Metformin and Januvia Oral agents on hold Sliding scale coverage  Hypertension -Blood pressure currently well controlled On amlodipine on hold Coreg resumed due to tachycardia As needed hydralazine  HLD: -Lipitor, dose increased to 40 mg daily  GERD: -protonix  DVT prophylaxis: Lovenox Code Status: Full Family Communication: Family member at bedside 3/29 Disposition Plan: Status is: Observation  The patient will require care spanning > 2 midnights and should be moved to inpatient because: Inpatient level of care appropriate due to severity of illness  Dispo: The patient is from: Home              Anticipated d/c is to: Home              Patient currently is not medically stable to d/c.   Difficult to place patient No   Patient will discharge home however has new onset systolic heart failure.  Cardiology consulted.  Patient may need TEE prior to discharge.    Level of care: Med-Surg  Consultants:   Neurology  Procedures:   None  Antimicrobials:   None   Subjective: Patient seen and examined.  Sitting up in chair.  No visible distress.  Objective: Vitals:   02/01/21 1611 02/02/21 0026 02/02/21 0759 02/02/21 1146  BP: 127/86 133/71 139/72 132/74  Pulse: 80 80 76  76  Resp: 16 17 16 16   Temp: 98.8 F (37.1 C) 98.7 F (37.1 C) 98.1 F (36.7 C) 98.2 F (36.8 C)  TempSrc: Oral Oral Oral Oral  SpO2: 99% 99% 99% 99%  Weight:      Height:         Intake/Output Summary (Last 24 hours) at 02/02/2021 1508 Last data filed at 02/02/2021 1414 Gross per 24 hour  Intake 360 ml  Output --  Net 360 ml   Filed Weights   01/31/21 0847 01/31/21 0942  Weight: 81.6 kg 83.9 kg    Examination:  General exam: Appears calm and comfortable  Respiratory system: Clear to auscultation. Respiratory effort normal. Cardiovascular system: S1 & S2 heard, RRR. No JVD, murmurs, rubs, gallops or clicks. No pedal edema. Gastrointestinal system: Abdomen is nondistended, soft and nontender. No organomegaly or masses felt. Normal bowel sounds heard. Central nervous system: Alert and oriented.  Right lateral visual loss Extremities: Symmetric 5 x 5 power. Skin: No rashes, lesions or ulcers Psychiatry: Judgement and insight appear normal. Mood & affect appropriate.     Data Reviewed: I have personally reviewed following labs and imaging studies  CBC: Recent Labs  Lab 01/31/21 0900  WBC 8.3  NEUTROABS 6.1  HGB 13.9  HCT 44.5  MCV 90.1  PLT 811   Basic Metabolic Panel: Recent Labs  Lab 01/31/21 0900 01/31/21 0929  NA 137  --   K 3.8  --   CL 103  --   CO2 24  --   GLUCOSE 189*  --   BUN 16  --   CREATININE 0.50 0.50  CALCIUM 8.9  --    GFR: Estimated Creatinine Clearance: 77 mL/min (by C-G formula based on SCr of 0.5 mg/dL). Liver Function Tests: Recent Labs  Lab 01/31/21 0900  AST 22  ALT 24  ALKPHOS 81  BILITOT 0.5  PROT 7.7  ALBUMIN 4.1   No results for input(s): LIPASE, AMYLASE in the last 168 hours. No results for input(s): AMMONIA in the last 168 hours. Coagulation Profile: Recent Labs  Lab 01/31/21 0900  INR 1.1   Cardiac Enzymes: No results for input(s): CKTOTAL, CKMB, CKMBINDEX, TROPONINI in the last 168 hours. BNP (last 3 results) No results for input(s): PROBNP in the last 8760 hours. HbA1C: Recent Labs    02/01/21 0257  HGBA1C 7.4*   CBG: Recent Labs  Lab 02/01/21 1325 02/01/21 1613  02/01/21 2130 02/02/21 0904 02/02/21 1150  GLUCAP 203* 164* 189* 172* 193*   Lipid Profile: Recent Labs    02/01/21 0257  CHOL 128  HDL 53  LDLCALC 69  TRIG 32  CHOLHDL 2.4   Thyroid Function Tests: No results for input(s): TSH, T4TOTAL, FREET4, T3FREE, THYROIDAB in the last 72 hours. Anemia Panel: No results for input(s): VITAMINB12, FOLATE, FERRITIN, TIBC, IRON, RETICCTPCT in the last 72 hours. Sepsis Labs: No results for input(s): PROCALCITON, LATICACIDVEN in the last 168 hours.  Recent Results (from the past 240 hour(s))  Resp Panel by RT-PCR (Flu A&B, Covid) Nasopharyngeal Swab     Status: None   Collection Time: 01/31/21  9:45 AM   Specimen: Nasopharyngeal Swab; Nasopharyngeal(NP) swabs in vial transport medium  Result Value Ref Range Status   SARS Coronavirus 2 by RT PCR NEGATIVE NEGATIVE Final    Comment: (NOTE) SARS-CoV-2 target nucleic acids are NOT DETECTED.  The SARS-CoV-2 RNA is generally detectable in upper respiratory specimens during the acute phase of infection. The lowest concentration of SARS-CoV-2 viral  copies this assay can detect is 138 copies/mL. A negative result does not preclude SARS-Cov-2 infection and should not be used as the sole basis for treatment or other patient management decisions. A negative result may occur with  improper specimen collection/handling, submission of specimen other than nasopharyngeal swab, presence of viral mutation(s) within the areas targeted by this assay, and inadequate number of viral copies(<138 copies/mL). A negative result must be combined with clinical observations, patient history, and epidemiological information. The expected result is Negative.  Fact Sheet for Patients:  EntrepreneurPulse.com.au  Fact Sheet for Healthcare Providers:  IncredibleEmployment.be  This test is no t yet approved or cleared by the [place] FDA and  has been authorized for detection  and/or diagnosis of SARS-CoV-2 by FDA under an Emergency Use Authorization (EUA). This EUA will remain  in effect (meaning this test can be used) for the duration of the COVID-19 declaration under Section 564(b)(1) of the Act, 21 U.S.C.section 360bbb-3(b)(1), unless the authorization is terminated  or revoked sooner.       Influenza A by PCR NEGATIVE NEGATIVE Final   Influenza B by PCR NEGATIVE NEGATIVE Final    Comment: (NOTE) The Xpert Xpress SARS-CoV-2/FLU/RSV plus assay is intended as an aid in the diagnosis of influenza from Nasopharyngeal swab specimens and should not be used as a sole basis for treatment. Nasal washings and aspirates are unacceptable for Xpert Xpress SARS-CoV-2/FLU/RSV testing.  Fact Sheet for Patients: EntrepreneurPulse.com.au  Fact Sheet for Healthcare Providers: IncredibleEmployment.be  This test is not yet approved or cleared by the [place] FDA and has been authorized for detection and/or diagnosis of SARS-CoV-2 by FDA under an Emergency Use Authorization (EUA). This EUA will remain in effect (meaning this test can be used) for the duration of the COVID-19 declaration under Section 564(b)(1) of the Act, 21 U.S.C. section 360bbb-3(b)(1), unless the authorization is terminated or revoked.  Performed at [facility], [address], [place],  [id number]          Radiology Studies: ECHOCARDIOGRAM COMPLETE  Result Date: 02/02/2021    ECHOCARDIOGRAM REPORT   Patient Name:   Janice Richmond Date of Exam: 02/02/2021 Medical Rec #:  [id number]        Height:       65.0 in Accession #:    [id number]       Weight:       185.0 lb Date of Birth:  1956-06-21        BSA:          1.914 m Patient Age:    32 years         BP:           139/72 mmHg Patient Gender: F                HR:           76 bpm. Exam Location:  [facility] Procedure: 2D Echo, Cardiac Doppler, Color Doppler and Strain Analysis Indications:      Stroke I63.9  History:         Patient has no prior history of Echocardiogram examinations.                  Risk Factors:Hypertension and Diabetes.  Sonographer:     Sherrie Sport RDCS (AE) Referring Phys:  [id number] Soledad Gerlach NIU Diagnosing Phys: Serafina Royals MD  Sonographer Comments: Global longitudinal strain was attempted. IMPRESSIONS  1. Left ventricular ejection fraction, by estimation, is 25 to 30%.  The left ventricle has severely decreased function. The left ventricle demonstrates regional wall motion abnormalities (see scoring diagram/findings for description). The left ventricular internal cavity size was mildly to moderately dilated. Left ventricular diastolic function could not be evaluated.  2. Right ventricular systolic function is normal. The right ventricular size is normal.  3. Left atrial size was mildly dilated.  4. The mitral valve is normal in structure. Mild mitral valve regurgitation.  5. The aortic valve is normal in structure. Aortic valve regurgitation is trivial. FINDINGS  Left Ventricle: Left ventricular ejection fraction, by estimation, is 25 to 30%. The left ventricle has severely decreased function. The left ventricle demonstrates regional wall motion abnormalities. The left ventricular internal cavity size was mildly  to moderately dilated. There is no left ventricular hypertrophy. Left ventricular diastolic function could not be evaluated. Right Ventricle: The right ventricular size is normal. No increase in right ventricular wall thickness. Right ventricular systolic function is normal. Left Atrium: Left atrial size was mildly dilated. Right Atrium: Right atrial size was normal in size. Pericardium: There is no evidence of pericardial effusion. Mitral Valve: The mitral valve is normal in structure. Mild mitral valve regurgitation. Tricuspid Valve: The tricuspid valve is normal in structure. Tricuspid valve regurgitation is mild. Aortic Valve: The aortic valve is normal in structure. Aortic  valve regurgitation is trivial. Aortic valve mean gradient measures 2.0 mmHg. Aortic valve peak gradient measures 3.7 mmHg. Aortic valve area, by VTI measures 1.41 cm. Pulmonic Valve: The pulmonic valve was normal in structure. Pulmonic valve regurgitation is not visualized. Aorta: The aortic root and ascending aorta are structurally normal, with no evidence of dilitation. IAS/Shunts: No atrial level shunt detected by color flow Doppler.  LEFT VENTRICLE PLAX 2D LVIDd:         4.86 cm      Diastology LVIDs:         4.53 cm      LV e' medial:    2.94 cm/s LV PW:         0.99 cm      LV E/e' medial:  14.9 LV IVS:        0.92 cm      LV e' lateral:   6.85 cm/s LVOT diam:     2.00 cm      LV E/e' lateral: 6.4 LV SV:         23 LV SV Index:   12 LVOT Area:     3.14 cm  LV Volumes (MOD) LV vol d, MOD A2C: 128.0 ml LV vol d, MOD A4C: 133.5 ml LV vol s, MOD A2C: 117.0 ml LV vol s, MOD A4C: 115.0 ml LV SV MOD A2C:     11.0 ml LV SV MOD A4C:     133.5 ml LV SV MOD BP:      12.9 ml RIGHT VENTRICLE RV Basal diam:  2.50 cm RV S prime:     11.10 cm/s TAPSE (M-mode): 2.9 cm LEFT ATRIUM             Index       RIGHT ATRIUM           Index LA diam:        4.00 cm 2.09 cm/m  RA Area:     10.30 cm LA Vol (A2C):   43.6 ml 22.78 ml/m RA Volume:   19.30 ml  10.09 ml/m LA Vol (A4C):   56.1 ml 29.32 ml/m LA Biplane Vol: 52.0 ml 27.17 ml/m  AORTIC VALVE                   PULMONIC VALVE AV Area (Vmax):    1.62 cm    RVOT Peak grad: 3 mmHg AV Area (Vmean):   1.30 cm AV Area (VTI):     1.41 cm AV Vmax:           96.10 cm/s AV Vmean:          64.200 cm/s AV VTI:            0.160 m AV Peak Grad:      3.7 mmHg AV Mean Grad:      2.0 mmHg LVOT Vmax:         49.60 cm/s LVOT Vmean:        26.600 cm/s LVOT VTI:          0.072 m LVOT/AV VTI ratio: 0.45  AORTA Ao Root diam: 2.97 cm MITRAL VALVE               TRICUSPID VALVE MV Area (PHT): 8.16 cm    TR Peak grad:   22.1 mmHg MV Decel Time: 93 msec     TR Vmax:        235.00 cm/s MV E  velocity: 43.70 cm/s MV A velocity: 75.40 cm/s  SHUNTS MV E/A ratio:  0.58        Systemic VTI:  0.07 m                            Systemic Diam: 2.00 cm Serafina Royals MD Electronically signed by Serafina Royals MD Signature Date/Time: 02/02/2021/2:10:50 PM    Final         Scheduled Meds: . aspirin EC  81 mg Oral Daily  . atorvastatin  40 mg Oral Daily  . carvedilol  25 mg Oral BID  . clopidogrel  75 mg Oral Daily  . enoxaparin (LOVENOX) injection  0.5 mg/kg Subcutaneous Q24H  . insulin aspart  0-5 Units Subcutaneous QHS  . insulin aspart  0-9 Units Subcutaneous TID WC  . latanoprost  1 drop Both Eyes QHS  . pantoprazole  40 mg Oral Daily  . timolol  1 drop Both Eyes Daily   Continuous Infusions:   LOS: 0 days    Time spent: 25 minutes    Sidney Ace, MD [facility] Pager [phone]  If 7PM-7AM, please contact night-coverage 02/02/2021, 3:08 PM

## 2021-02-02 NOTE — Care Plan (Addendum)
Echocardiogram reviewed, EF 25 to 30% in a patient with no known history of cardiac failure.  No mention of intracardiac thrombus in the report but primary team will confirm with cardiology that they feel the study adequately excludes an intracardiac mass. If TTE is not felt to be sufficiently diagnostic in this patient, may need TEE.   Given this finding, suspect cardioembolic stroke very strongly and if there is no intracardiac thrombus, patient needs a cardiac monitor at discharge.  Appreciate cardiology evaluation for new heart failure with reduced EF

## 2021-02-03 ENCOUNTER — Encounter
Admission: EM | Disposition: A | Discharge status: Home / Self Care | Payer: Self-pay | Source: Home / Self Care | Attending: Emergency Medicine

## 2021-02-03 ENCOUNTER — Observation Stay
Admit: 2021-02-03 | Discharge: 2021-02-03 | Disposition: A | Discharge status: Home / Self Care | Payer: BC Managed Care – PPO | Attending: Internal Medicine | Admitting: Internal Medicine

## 2021-02-03 DIAGNOSIS — E785 Hyperlipidemia, unspecified: Secondary | ICD-10-CM

## 2021-02-03 DIAGNOSIS — H53461 Homonymous bilateral field defects, right side: Secondary | ICD-10-CM

## 2021-02-03 DIAGNOSIS — E1169 Type 2 diabetes mellitus with other specified complication: Secondary | ICD-10-CM

## 2021-02-03 DIAGNOSIS — K219 Gastro-esophageal reflux disease without esophagitis: Secondary | ICD-10-CM

## 2021-02-03 DIAGNOSIS — I63432 Cerebral infarction due to embolism of left posterior cerebral artery: Secondary | ICD-10-CM | POA: Diagnosis not present

## 2021-02-03 DIAGNOSIS — I429 Cardiomyopathy, unspecified: Secondary | ICD-10-CM | POA: Diagnosis not present

## 2021-02-03 HISTORY — PX: TEE WITHOUT CARDIOVERSION: SHX5443

## 2021-02-03 LAB — GLUCOSE, CAPILLARY
Glucose-Capillary: 195 mg/dL — ABNORMAL HIGH (ref 70–99)
Glucose-Capillary: 147 mg/dL — ABNORMAL HIGH (ref 70–99)
Glucose-Capillary: 140 mg/dL — ABNORMAL HIGH (ref 70–99)

## 2021-02-03 LAB — CBC
HCT: 41.8 % (ref 36.0–46.0)
Hemoglobin: 13.7 g/dL (ref 12.0–15.0)
MCH: 29.1 pg (ref 26.0–34.0)
MCHC: 32.8 g/dL (ref 30.0–36.0)
MCV: 88.9 fL (ref 80.0–100.0)
Platelets: 259 10*3/uL (ref 150–400)
RBC: 4.7 MIL/uL (ref 3.87–5.11)
RDW: 14.6 % (ref 11.5–15.5)
WBC: 7.1 10*3/uL (ref 4.0–10.5)
nRBC: 0 % (ref 0.0–0.2)

## 2021-02-03 LAB — CREATININE, SERUM
Creatinine, Ser: 0.68 mg/dL (ref 0.44–1.00)
GFR, Estimated: >60 mL/min (ref >60)

## 2021-02-03 SURGERY — ECHOCARDIOGRAM, TRANSESOPHAGEAL
Anesthesia: Moderate Sedation

## 2021-02-03 MED ORDER — FENTANYL CITRATE (PF) 100 MCG/2ML IJ SOLN
INTRAMUSCULAR | Status: AC | PRN
  Administered 2021-02-03 (×2): 25 ug via INTRAVENOUS
  Administered 2021-02-03: 50 ug via INTRAVENOUS

## 2021-02-03 MED ORDER — CLOPIDOGREL BISULFATE 75 MG PO TABS: 75.0000 mg | ORAL_TABLET | Freq: Every day | ORAL | 0 refills | Status: AC

## 2021-02-03 MED ORDER — BUTAMBEN-TETRACAINE-BENZOCAINE 2-2-14 % EX AERO
INHALATION_SPRAY | CUTANEOUS | Status: AC
  Filled 2021-02-03: qty 5

## 2021-02-03 MED ORDER — MIDAZOLAM HCL 5 MG/5ML IJ SOLN
INTRAMUSCULAR | Status: AC | PRN
  Administered 2021-02-03: 2 mg via INTRAVENOUS
  Administered 2021-02-03: 1 mg via INTRAVENOUS

## 2021-02-03 MED ORDER — PANTOPRAZOLE SODIUM 40 MG PO TBEC: 40.0000 mg | DELAYED_RELEASE_TABLET | Freq: Every day | ORAL | 0 refills | Status: AC

## 2021-02-03 MED ORDER — ATORVASTATIN CALCIUM 40 MG PO TABS: 40.0000 mg | ORAL_TABLET | Freq: Every day | ORAL | 0 refills | Status: AC

## 2021-02-03 MED ORDER — LOSARTAN POTASSIUM 25 MG PO TABS: 25.0000 mg | ORAL_TABLET | Freq: Every day | ORAL | 0 refills | Status: AC

## 2021-02-03 MED ORDER — LIDOCAINE VISCOUS HCL 2 % MT SOLN
OROMUCOSAL | Status: AC
  Filled 2021-02-03: qty 15

## 2021-02-03 MED ORDER — SODIUM CHLORIDE 0.9 % IV SOLN: INTRAVENOUS | Status: DC

## 2021-02-03 MED ORDER — SODIUM CHLORIDE FLUSH 0.9 % IV SOLN
INTRAVENOUS | Status: AC
  Filled 2021-02-03: qty 10

## 2021-02-03 MED ORDER — LOSARTAN POTASSIUM 25 MG PO TABS
25.0000 mg | ORAL_TABLET | Freq: Every day | ORAL | Status: DC
  Administered 2021-02-03: 10:00:00 25 mg via ORAL
  Filled 2021-02-03: qty 1

## 2021-02-03 MED ORDER — MIDAZOLAM HCL 5 MG/5ML IJ SOLN
INTRAMUSCULAR | Status: AC
  Filled 2021-02-03: qty 5

## 2021-02-03 MED ORDER — FENTANYL CITRATE (PF) 100 MCG/2ML IJ SOLN
INTRAMUSCULAR | Status: AC
  Filled 2021-02-03: qty 2

## 2021-02-03 NOTE — Plan of Care (Signed)
  Problem: Education: Goal: Knowledge of secondary prevention will improve Outcome: Progressing Goal: Knowledge of patient specific risk factors addressed and post discharge goals established will improve Outcome: Progressing   Problem: Ischemic Stroke/TIA Tissue Perfusion: Goal: Complications of ischemic stroke/TIA will be minimized Outcome: Progressing   Problem: Education: Goal: Knowledge of General Education information will improve Description: Including pain rating scale, medication(s)/side effects and non-pharmacologic comfort measures Outcome: Progressing   Problem: Clinical Measurements: Goal: Ability to maintain clinical measurements within normal limits will improve Outcome: Progressing   Problem: Pain Managment: Goal: General experience of comfort will improve Outcome: Progressing   Problem: Safety: Goal: Ability to remain free from injury will improve Outcome: Progressing

## 2021-02-03 NOTE — Progress Notes (Signed)
PT Cancellation Note  Patient Details Name: Janice Richmond MRN: [id number] DOB: 22-May-1956   Cancelled Treatment:     Pt currently off floor for test. Will return later this date and continue to follow per POC.    Willette Pa 02/03/2021, 12:50 PM

## 2021-02-03 NOTE — Discharge Summary (Signed)
Janice Richmond at [place] NAME: Janice Richmond    MR#:  [id number]  DATE OF BIRTH:  Feb 12, 1956  DATE OF ADMISSION:  01/31/2021 ADMITTING PHYSICIAN: Ivor Costa, MD  DATE OF DISCHARGE: 02/03/2021  PRIMARY CARE PHYSICIAN: Marguerita Merles, MD    ADMISSION DIAGNOSIS:  Right homonymous hemianopsia [H53.461] Stroke [facility]) [I63.9] Cerebrovascular accident (CVA), unspecified mechanism ([place]) [I63.9]  DISCHARGE DIAGNOSIS:  Principal Problem:   Stroke [facility]) Active Problems:   Diabetes mellitus without complication ([place])   Hypertension   HLD (hyperlipidemia)   GERD (gastroesophageal reflux disease)   SECONDARY DIAGNOSIS:   Past Medical History:  Diagnosis Date  . Breast cancer ([place]) 1993   left breast  . Diabetes mellitus without complication ([place])   . GERD (gastroesophageal reflux disease)   . HLD (hyperlipidemia)   . Hypertension     HOSPITAL COURSE:   1.  Acute large left PCA territory infarction.  Patient strength and coordination is good.  She has difficulty with vision of her right eye.  I advised no driving.  Advised her to follow-up with her eye doctor.  Continue aspirin, Plavix and increased dose of atorvastatin.  CT angio of the neck negative for stenosis.  Echocardiogram and TEE done by Dr. Nehemiah Massed.  Ejection fraction 25 to 30%.  Some atherosclerosis of the aortic arch.  No clot seen in the heart.  Dr. Nehemiah Massed to do a prolonged heart monitor in the follow-up appointment.  LDL 69. 2.  Newly diagnosed cardiomyopathy.  Patient already on Coreg at home.  Add low-dose Cozaar.  Follow-up with cardiology as outpatient.  With the acute stroke allow blood pressure to be a little bit on the higher side.  Likely can add spironolactone as outpatient. 3.  Type 2 diabetes mellitus with hyperlipidemia.  On atorvastatin increased dose.  Hold on Metformin for a few days but then can go back on it.  Continue Januvia. 4.  Essential hypertension, tachycardia.   Continue Coreg.  Added low-dose Cozaar with cardiomyopathy.  Discontinue amlodipine 5.  GERD.  Switch Nexium over to Protonix since the patient will be on Plavix.  DISCHARGE CONDITIONS:   Satisfactory  CONSULTS OBTAINED:  Neurology Cardiology  DRUG ALLERGIES:  No Known Allergies  DISCHARGE MEDICATIONS:   Allergies as of 02/03/2021   No Known Allergies     Medication List    STOP taking these medications   amLODipine 10 MG tablet Commonly known as: NORVASC   esomeprazole 40 MG capsule Commonly known as: [place] by: pantoprazole 40 MG tablet   metFORMIN 500 MG tablet Commonly known as: GLUCOPHAGE     TAKE these medications   aspirin EC 81 MG tablet Take 81 mg by mouth daily. Swallow whole.   atorvastatin 40 MG tablet Commonly known as: LIPITOR Take 1 tablet (40 mg total) by mouth daily. Start taking on: February 04, 2021 What changed:   medication strength  how much to take   carvedilol 25 MG tablet Commonly known as: COREG Take 25 mg by mouth 2 (two) times daily.   clopidogrel 75 MG tablet Commonly known as: PLAVIX Take 1 tablet (75 mg total) by mouth daily. Start taking on: February 04, 2021   Januvia 100 MG tablet Generic drug: sitaGLIPtin Take 100 mg by mouth daily.   latanoprost 0.005 % ophthalmic solution Commonly known as: XALATAN Place 1 drop into both eyes at bedtime.   losartan 25 MG tablet Commonly known as: COZAAR Take 1 tablet (25 mg  total) by mouth daily. Start taking on: February 04, 2021   pantoprazole 40 MG tablet Commonly known as: PROTONIX Take 1 tablet (40 mg total) by mouth daily. Start taking on: February 04, 2021 Replaces: esomeprazole 40 MG capsule   timolol 0.5 % ophthalmic solution Commonly known as: TIMOPTIC Place 1 drop into both eyes daily.        DISCHARGE INSTRUCTIONS:   Follow-up PCP 5 days Follow-up cardiology 1 week  If you experience worsening of your admission symptoms, develop shortness of breath,  life threatening emergency, suicidal or homicidal thoughts you must seek medical attention immediately by calling 911 or calling your MD immediately  if symptoms less severe.  You Must read complete instructions/literature along with all the possible adverse reactions/side effects for all the Medicines you take and that have been prescribed to you. Take any new Medicines after you have completely understood and accept all the possible adverse reactions/side effects.   Please note  You were cared for by a hospitalist during your hospital stay. If you have any questions about your discharge medications or the care you received while you were in the hospital after you are discharged, you can call the unit and asked to speak with the hospitalist on call if the hospitalist that took care of you is not available. Once you are discharged, your primary care physician will handle any further medical issues. Please note that NO REFILLS for any discharge medications will be authorized once you are discharged, as it is imperative that you return to your primary care physician (or establish a relationship with a primary care physician if you do not have one) for your aftercare needs so that they can reassess your need for medications and monitor your lab values.    Today   CHIEF COMPLAINT:   Chief Complaint  Patient presents with  . Headache  . Blurred Vision    HISTORY OF PRESENT ILLNESS:  Janice Richmond  is a 65 y.o. female came in with headache and blurred vision   VITAL SIGNS:  Blood pressure 132/84, pulse 69, temperature 98.3 F (36.8 C), temperature source Oral, resp. rate 14, height 5\' 5"  (1.651 m), weight 83.9 kg, SpO2 90 %.  I/O:    Intake/Output Summary (Last 24 hours) at 02/03/2021 1649 Last data filed at 02/03/2021 1003 Gross per 24 hour  Intake 0 ml  Output --  Net 0 ml    PHYSICAL EXAMINATION:  GENERAL:  65 y.o.-year-old patient lying in the bed with no acute distress.  EYES:  Pupils equal, round, reactive to light and accommodation. No scleral icterus. Extraocular muscles intact.  HEENT: Head atraumatic, normocephalic. Oropharynx and nasopharynx clear.  LUNGS: Normal breath sounds bilaterally, no wheezing, rales,rhonchi or crepitation. No use of accessory muscles of respiration.  CARDIOVASCULAR: S1, S2 normal. No murmurs, rubs, or gallops.  ABDOMEN: Soft, non-tender, non-distended. Bowel sounds present. No organomegaly or mass.  EXTREMITIES: No pedal edema, cyanosis, or clubbing.  NEUROLOGIC: Cranial nerves II through XII are intact. Muscle strength 5/5 in all extremities. Sensation intact. Gait not checked.  PSYCHIATRIC: The patient is alert and oriented x 3.  SKIN: No obvious rash, lesion, or ulcer.   DATA REVIEW:   CBC Recent Labs  Lab 02/03/21 0640  WBC 7.1  HGB 13.7  HCT 41.8  PLT 259    Chemistries  Recent Labs  Lab 01/31/21 0900 01/31/21 0929 02/03/21 0640  NA 137  --   --   K 3.8  --   --  CL 103  --   --   CO2 24  --   --   GLUCOSE 189*  --   --   BUN 16  --   --   CREATININE 0.50   < > 0.68  CALCIUM 8.9  --   --   AST 22  --   --   ALT 24  --   --   ALKPHOS 81  --   --   BILITOT 0.5  --   --    < > = values in this interval not displayed.     Microbiology Results  Results for orders placed or performed during the hospital encounter of 01/31/21  Resp Panel by RT-PCR (Flu A&B, Covid) Nasopharyngeal Swab     Status: None   Collection Time: 01/31/21  9:45 AM   Specimen: Nasopharyngeal Swab; Nasopharyngeal(NP) swabs in vial transport medium  Result Value Ref Range Status   SARS Coronavirus 2 by RT PCR NEGATIVE NEGATIVE Final    Comment: (NOTE) SARS-CoV-2 target nucleic acids are NOT DETECTED.  The SARS-CoV-2 RNA is generally detectable in upper respiratory specimens during the acute phase of infection. The lowest concentration of SARS-CoV-2 viral copies this assay can detect is 138 copies/mL. A negative result does not  preclude SARS-Cov-2 infection and should not be used as the sole basis for treatment or other patient management decisions. A negative result may occur with  improper specimen collection/handling, submission of specimen other than nasopharyngeal swab, presence of viral mutation(s) within the areas targeted by this assay, and inadequate number of viral copies(<138 copies/mL). A negative result must be combined with clinical observations, patient history, and epidemiological information. The expected result is Negative.  Fact Sheet for Patients:  EntrepreneurPulse.com.au  Fact Sheet for Healthcare Providers:  IncredibleEmployment.be  This test is no t yet approved or cleared by the [place] FDA and  has been authorized for detection and/or diagnosis of SARS-CoV-2 by FDA under an Emergency Use Authorization (EUA). This EUA will remain  in effect (meaning this test can be used) for the duration of the COVID-19 declaration under Section 564(b)(1) of the Act, 21 U.S.C.section 360bbb-3(b)(1), unless the authorization is terminated  or revoked sooner.       Influenza A by PCR NEGATIVE NEGATIVE Final   Influenza B by PCR NEGATIVE NEGATIVE Final    Comment: (NOTE) The Xpert Xpress SARS-CoV-2/FLU/RSV plus assay is intended as an aid in the diagnosis of influenza from Nasopharyngeal swab specimens and should not be used as a sole basis for treatment. Nasal washings and aspirates are unacceptable for Xpert Xpress SARS-CoV-2/FLU/RSV testing.  Fact Sheet for Patients: EntrepreneurPulse.com.au  Fact Sheet for Healthcare Providers: IncredibleEmployment.be  This test is not yet approved or cleared by the [place] FDA and has been authorized for detection and/or diagnosis of SARS-CoV-2 by FDA under an Emergency Use Authorization (EUA). This EUA will remain in effect (meaning this test can be used) for the  duration of the COVID-19 declaration under Section 564(b)(1) of the Act, 21 U.S.C. section 360bbb-3(b)(1), unless the authorization is terminated or revoked.  Performed at [facility], [address] [place], [place] [address]     RADIOLOGY:  ECHOCARDIOGRAM COMPLETE  Result Date: 02/02/2021    ECHOCARDIOGRAM REPORT   Patient Name:   ISIDORA LAHAM Date of Exam: 02/02/2021 Medical Rec #:  [id number]        Height:       65.0 in Accession #:    [id number]  Weight:       185.0 lb Date of Birth:  11-18-1955        BSA:          1.914 m Patient Age:    16 years         BP:           139/72 mmHg Patient Gender: F                HR:           76 bpm. Exam Location:  [facility] Procedure: 2D Echo, Cardiac Doppler, Color Doppler and Strain Analysis Indications:     Stroke I63.9  History:         Patient has no prior history of Echocardiogram examinations.                  Risk Factors:Hypertension and Diabetes.  Sonographer:     Sherrie Sport RDCS (AE) Referring Phys:  [id number] Soledad Gerlach NIU Diagnosing Phys: Serafina Royals MD  Sonographer Comments: Global longitudinal strain was attempted. IMPRESSIONS  1. Left ventricular ejection fraction, by estimation, is 25 to 30%. The left ventricle has severely decreased function. The left ventricle demonstrates regional wall motion abnormalities (see scoring diagram/findings for description). The left ventricular internal cavity size was mildly to moderately dilated. Left ventricular diastolic function could not be evaluated.  2. Right ventricular systolic function is normal. The right ventricular size is normal.  3. Left atrial size was mildly dilated.  4. The mitral valve is normal in structure. Mild mitral valve regurgitation.  5. The aortic valve is normal in structure. Aortic valve regurgitation is trivial. FINDINGS  Left Ventricle: Left ventricular ejection fraction, by estimation, is 25 to 30%. The left ventricle has severely decreased function. The left ventricle  demonstrates regional wall motion abnormalities. The left ventricular internal cavity size was mildly  to moderately dilated. There is no left ventricular hypertrophy. Left ventricular diastolic function could not be evaluated. Right Ventricle: The right ventricular size is normal. No increase in right ventricular wall thickness. Right ventricular systolic function is normal. Left Atrium: Left atrial size was mildly dilated. Right Atrium: Right atrial size was normal in size. Pericardium: There is no evidence of pericardial effusion. Mitral Valve: The mitral valve is normal in structure. Mild mitral valve regurgitation. Tricuspid Valve: The tricuspid valve is normal in structure. Tricuspid valve regurgitation is mild. Aortic Valve: The aortic valve is normal in structure. Aortic valve regurgitation is trivial. Aortic valve mean gradient measures 2.0 mmHg. Aortic valve peak gradient measures 3.7 mmHg. Aortic valve area, by VTI measures 1.41 cm. Pulmonic Valve: The pulmonic valve was normal in structure. Pulmonic valve regurgitation is not visualized. Aorta: The aortic root and ascending aorta are structurally normal, with no evidence of dilitation. IAS/Shunts: No atrial level shunt detected by color flow Doppler.  LEFT VENTRICLE PLAX 2D LVIDd:         4.86 cm      Diastology LVIDs:         4.53 cm      LV e' medial:    2.94 cm/s LV PW:         0.99 cm      LV E/e' medial:  14.9 LV IVS:        0.92 cm      LV e' lateral:   6.85 cm/s LVOT diam:     2.00 cm      LV E/e' lateral: 6.4 LV SV:  23 LV SV Index:   12 LVOT Area:     3.14 cm  LV Volumes (MOD) LV vol d, MOD A2C: 128.0 ml LV vol d, MOD A4C: 133.5 ml LV vol s, MOD A2C: 117.0 ml LV vol s, MOD A4C: 115.0 ml LV SV MOD A2C:     11.0 ml LV SV MOD A4C:     133.5 ml LV SV MOD BP:      12.9 ml RIGHT VENTRICLE RV Basal diam:  2.50 cm RV S prime:     11.10 cm/s TAPSE (M-mode): 2.9 cm LEFT ATRIUM             Index       RIGHT ATRIUM           Index LA diam:         4.00 cm 2.09 cm/m  RA Area:     10.30 cm LA Vol (A2C):   43.6 ml 22.78 ml/m RA Volume:   19.30 ml  10.09 ml/m LA Vol (A4C):   56.1 ml 29.32 ml/m LA Biplane Vol: 52.0 ml 27.17 ml/m  AORTIC VALVE                   PULMONIC VALVE AV Area (Vmax):    1.62 cm    RVOT Peak grad: 3 mmHg AV Area (Vmean):   1.30 cm AV Area (VTI):     1.41 cm AV Vmax:           96.10 cm/s AV Vmean:          64.200 cm/s AV VTI:            0.160 m AV Peak Grad:      3.7 mmHg AV Mean Grad:      2.0 mmHg LVOT Vmax:         49.60 cm/s LVOT Vmean:        26.600 cm/s LVOT VTI:          0.072 m LVOT/AV VTI ratio: 0.45  AORTA Ao Root diam: 2.97 cm MITRAL VALVE               TRICUSPID VALVE MV Area (PHT): 8.16 cm    TR Peak grad:   22.1 mmHg MV Decel Time: 93 msec     TR Vmax:        235.00 cm/s MV E velocity: 43.70 cm/s MV A velocity: 75.40 cm/s  SHUNTS MV E/A ratio:  0.58        Systemic VTI:  0.07 m                            Systemic Diam: 2.00 cm Serafina Royals MD Electronically signed by Serafina Royals MD Signature Date/Time: 02/02/2021/2:10:50 PM    Final      Management plans discussed with the patient, family and they are in agreement.  CODE STATUS:     Code Status Orders  (From admission, onward)         Start     Ordered   01/31/21 1026  Full code  Continuous        01/31/21 1025        Code Status History    This patient has a current code status but no historical code status.   Advance Care Planning Activity      TOTAL TIME TAKING CARE OF THIS PATIENT: 35 minutes.    Loletha Grayer M.D on 02/03/2021  at 4:49 PM  Between 7am to 6pm - Pager - [phone]  After 6pm go to [email or web address] - password [email or web address]  [facility]  CC: Primary care physician; Marguerita Merles, MD

## 2021-02-03 NOTE — Consult Note (Signed)
[facility] Cardiology Consultation Note  Patient ID: Janice Richmond, MRN: [id number], DOB/AGE: [birth year] 65 y.o. Admit date: 01/31/2021   Date of Consult: 02/03/2021 Primary Physician: Marguerita Merles, MD Primary Cardiologist: None  Chief Complaint:  Chief Complaint  Patient presents with  . Headache  . Blurred Vision   Reason for Consult: Stroke  HPI: 65 y.o. female with known diabetes hypertension hyperlipidemia with significant new stroke with visual abnormalities and occipital changes.  There is occlusion of posterior circulation artery likely causing this issue.  The patient has had some residual visual issues as well.  Currently the patient has been having no evidence of cardiovascular symptoms although echocardiogram has recently shown that she has had global LV systolic dysfunction with ejection fraction of 25 to 30%.  She has been placed on aspirin and Plavix as well as anticoagulation for further treatment of her recent stroke.  Addition of carvedilol has also been given for cardiomyopathy.  High intensity cholesterol therapy has been treating her lipid management and possible cardiovascular disease without evidence of side effects.  There has been concerns that this is somewhat a cryptogenic stroke and may need further evaluation and treatment options.  This includes the possibility of rhythm disturbances which have not been seen during this hospitalization.  Additionally transesophageal echocardiogram for further evaluation of aortic atherosclerosis as well as valvular heart disease contributing to above.  Patient has been informed of this evaluation and understands the risk and benefits of this particular treatment option.  Past Medical History:  Diagnosis Date  . Breast cancer ([place]) 1993   left breast  . Diabetes mellitus without complication ([place])   . GERD (gastroesophageal reflux disease)   . HLD (hyperlipidemia)   . Hypertension       Surgical History:  Past  Surgical History:  Procedure Laterality Date  . MASTECTOMY Left 1993   mastectomy with chemotherapy     Home Meds: Prior to Admission medications   Medication Sig Start Date End Date Taking? Authorizing Provider  amLODipine (NORVASC) 10 MG tablet Take 10 mg by mouth daily. 01/26/21  Yes [provider]  aspirin EC 81 MG tablet Take 81 mg by mouth daily. Swallow whole.   Yes [provider]  atorvastatin (LIPITOR) 20 MG tablet Take 20 mg by mouth daily. 12/15/20  Yes [provider]  carvedilol (COREG) 25 MG tablet Take 25 mg by mouth 2 (two) times daily. 01/15/21  Yes [provider]  esomeprazole (NEXIUM) 40 MG capsule Take 40 mg by mouth daily. 11/30/20  Yes [provider]  JANUVIA 100 MG tablet Take 100 mg by mouth daily. 11/19/20  Yes [provider]  latanoprost (XALATAN) 0.005 % ophthalmic solution Place 1 drop into both eyes at bedtime. 10/22/20  Yes [provider]  metFORMIN (GLUCOPHAGE) 500 MG tablet Take 500 mg by mouth 2 (two) times daily with a meal.   Yes [provider]  timolol (TIMOPTIC) 0.5 % ophthalmic solution Place 1 drop into both eyes daily. 09/01/20  Yes [provider]    Inpatient Medications:  . aspirin EC  81 mg Oral Daily  . atorvastatin  40 mg Oral Daily  . carvedilol  25 mg Oral BID  . clopidogrel  75 mg Oral Daily  . enoxaparin (LOVENOX) injection  0.5 mg/kg Subcutaneous Q24H  . insulin aspart  0-5 Units Subcutaneous QHS  . insulin aspart  0-9 Units Subcutaneous TID WC  . latanoprost  1 drop Both Eyes QHS  .  pantoprazole  40 mg Oral Daily  . timolol  1 drop Both Eyes Daily     Allergies: No Known Allergies  Social History   Socioeconomic History  . Marital status: Widowed    Spouse name: Not on file  . Number of children: Not on file  . Years of education: Not on file  . Highest education level: Not on file  Occupational History  . Not on file  Tobacco Use  .  Smoking status: Former Research scientist (life sciences)  . Smokeless tobacco: Never Used  Substance and Sexual Activity  . Alcohol use: Never  . Drug use: Never  . Sexual activity: Not on file  Other Topics Concern  . Not on file  Social History Narrative  . Not on file   Social Determinants of Health   Financial Resource Strain: Not on file  Food Insecurity: Not on file  Transportation Needs: Not on file  Physical Activity: Not on file  Stress: Not on file  Social Connections: Not on file  Intimate Partner Violence: Not on file     Family History  Problem Relation Age of Onset  . Breast cancer Other 40       did have BRCA testing and it was negative  . Pancreatic cancer Other 57       two nephews  . Breast cancer Sister 74       2 sisters, ages 25 and 64  . Pancreatic cancer Mother 48     Review of Systems Positive for visual issues Negative for: General:  chills, fever, night sweats or weight changes.  Cardiovascular: PND orthopnea syncope dizziness  Dermatological skin lesions rashes Respiratory: Cough congestion Urologic: Frequent urination urination at night and hematuria Abdominal: negative for nausea, vomiting, diarrhea, bright red blood per rectum, melena, or hematemesis Neurologic: negative for visual changes, and/or hearing changes  All other systems reviewed and are otherwise negative except as noted above.  Labs: No results for input(s): CKTOTAL, CKMB, TROPONINI in the last 72 hours. Lab Results  Component Value Date   WBC 7.1 02/03/2021   HGB 13.7 02/03/2021   HCT 41.8 02/03/2021   MCV 88.9 02/03/2021   PLT 259 02/03/2021    Recent Labs  Lab 01/31/21 0900 01/31/21 0929 02/03/21 0640  NA 137  --   --   K 3.8  --   --   CL 103  --   --   CO2 24  --   --   BUN 16  --   --   CREATININE 0.50   < > 0.68  CALCIUM 8.9  --   --   PROT 7.7  --   --   BILITOT 0.5  --   --   ALKPHOS 81  --   --   ALT 24  --   --   AST 22  --   --   GLUCOSE 189*  --   --    < > = values  in this interval not displayed.   Lab Results  Component Value Date   CHOL 128 02/01/2021   HDL 53 02/01/2021   LDLCALC 69 02/01/2021   TRIG 32 02/01/2021   No results found for: DDIMER  Radiology/Studies:  CT ANGIOGRAM HEAD NECK W WO CONTRAST  Result Date: 01/31/2021 CLINICAL DATA:  Acute stroke EXAM: CT ANGIOGRAPHY HEAD AND NECK TECHNIQUE: Multidetector CT imaging of the head and neck was performed using the standard protocol during bolus administration of intravenous contrast. Multiplanar CT image  reconstructions and MIPs were obtained to evaluate the vascular anatomy. Carotid stenosis measurements (when applicable) are obtained utilizing NASCET criteria, using the distal internal carotid diameter as the denominator. CONTRAST:  23m OMNIPAQUE IOHEXOL 350 MG/ML SOLN COMPARISON:  Head CT from earlier today FINDINGS: CTA NECK FINDINGS Aortic arch: 2 vessel branching.  No acute finding. Right carotid system: Vessels are smooth and widely patent. No noted atheromatous changes. Left carotid system: Vessels are smooth and widely patent. No noted atheromatous changes. Vertebral arteries: No proximal subclavian stenosis. Both vertebral arteries are smoothly contoured and widely patent to the dura Skeleton: Cervical spine degeneration with reversed lordosis. Other neck: Small calcification in the right lobe thyroid, incidental. No acute finding. Upper chest: Airway thickening, interlobular septal thickening, and ground-glass opacity consistent with pulmonary edema. Review of the MIP images confirms the above findings CTA HEAD FINDINGS Anterior circulation: Mild calcified plaque along the carotid siphons. No branch occlusion, beading, or aneurysm. Posterior circulation: The vertebral arteries are symmetric in size. Widely patent vertebral and basilar arteries. Left PCA branch occlusion correlating with the size of infarct. Negative for aneurysm Venous sinuses: Diffusely patent Anatomic variants: None  significant Review of the MIP images confirms the above findings IMPRESSION: 1. 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 2. No underlying stenosis or embolic source is seen in the proximal vessels. Atheromatous changes are mild. 3. Pulmonary edema. Electronically Signed   By: JMonte FantasiaM.D.   On: 01/31/2021 10:01   MR BRAIN WO CONTRAST  Result Date: 01/31/2021 CLINICAL DATA:  Left PCA stroke on CT EXAM: MRI HEAD WITHOUT CONTRAST TECHNIQUE: Multiplanar, multiecho pulse sequences of the brain and surrounding structures were obtained without intravenous contrast. COMPARISON:  None. FINDINGS: Brain: There is restricted diffusion in the left occipital lobe extending into the posterior temporal lobe reflecting known PCA territory infarction. No evidence of hemorrhagic transformation. No significant mass effect. Additional patchy foci of T2 hyperintensity in the supratentorial white matter are nonspecific but may reflect minor chronic microvascular ischemic changes. Ventricles and sulci are within normal limits in size and configuration. There is no intracranial mass. There is no hydrocephalus or extra-axial fluid collection. Vascular: Major vessel flow voids at the skull base are preserved. Skull and upper cervical spine: Normal marrow signal is preserved. Sinuses/Orbits: Paranasal sinuses are aerated. Orbits are unremarkable. Other: Sella is unremarkable.  Mastoid air cells are clear. IMPRESSION: Acute large left PCA territory infarction. No hemorrhage or significant mass effect. Minor chronic microvascular ischemic changes. Electronically Signed   By: PMacy MisM.D.   On: 01/31/2021 14:17   ECHOCARDIOGRAM COMPLETE  Result Date: 02/02/2021    ECHOCARDIOGRAM REPORT   Patient Name:   Janice LIGHTCAPDate of Exam: 02/02/2021 Medical Rec #:  [id number]       Height:       65.0 in Accession #:    [id number]      Weight:       185.0 lb Date of Birth:  111/16/1957        BSA:          1.914 m Patient Age:    89years         BP:           139/72 mmHg Patient Gender: F                HR:           76 bpm. [place] [facility]  [facility]  Procedure: 2D Echo, Cardiac Doppler, Color Doppler and Strain Analysis Indications:     Stroke I63.9  History:         Patient has no prior history of Echocardiogram examinations.                  Risk Factors:Hypertension and Diabetes.  Sonographer:     Sherrie Sport RDCS (AE) Referring Phys:  [id number] Soledad Gerlach NIU Diagnosing Phys: Serafina Royals MD  Sonographer Comments: Global longitudinal strain was attempted. IMPRESSIONS  1. Left ventricular ejection fraction, by estimation, is 25 to 30%. The left ventricle has severely decreased function. The left ventricle demonstrates regional wall motion abnormalities (see scoring diagram/findings for description). The left ventricular internal cavity size was mildly to moderately dilated. Left ventricular diastolic function could not be evaluated.  2. Right ventricular systolic function is normal. The right ventricular size is normal.  3. Left atrial size was mildly dilated.  4. The mitral valve is normal in structure. Mild mitral valve regurgitation.  5. The aortic valve is normal in structure. Aortic valve regurgitation is trivial. FINDINGS  Left Ventricle: Left ventricular ejection fraction, by estimation, is 25 to 30%. The left ventricle has severely decreased function. The left ventricle demonstrates regional wall motion abnormalities. The left ventricular internal cavity size was mildly  to moderately dilated. There is no left ventricular hypertrophy. Left ventricular diastolic function could not be evaluated. Right Ventricle: The right ventricular size is normal. No increase in right ventricular wall thickness. Right ventricular systolic function is normal. Left Atrium: Left atrial size was mildly dilated. Right Atrium: Right atrial size was normal in size. Pericardium: There is no evidence of pericardial effusion.  Mitral Valve: The mitral valve is normal in structure. Mild mitral valve regurgitation. Tricuspid Valve: The tricuspid valve is normal in structure. Tricuspid valve regurgitation is mild. Aortic Valve: The aortic valve is normal in structure. Aortic valve regurgitation is trivial. Aortic valve mean gradient measures 2.0 mmHg. Aortic valve peak gradient measures 3.7 mmHg. Aortic valve area, by VTI measures 1.41 cm. Pulmonic Valve: The pulmonic valve was normal in structure. Pulmonic valve regurgitation is not visualized. Aorta: The aortic root and ascending aorta are structurally normal, with no evidence of dilitation. IAS/Shunts: No atrial level shunt detected by color flow Doppler.  LEFT VENTRICLE PLAX 2D LVIDd:         4.86 cm      Diastology LVIDs:         4.53 cm      LV e' medial:    2.94 cm/s LV PW:         0.99 cm      LV E/e' medial:  14.9 LV IVS:        0.92 cm      LV e' lateral:   6.85 cm/s LVOT diam:     2.00 cm      LV E/e' lateral: 6.4 LV SV:         23 LV SV Index:   12 LVOT Area:     3.14 cm  LV Volumes (MOD) LV vol d, MOD A2C: 128.0 ml LV vol d, MOD A4C: 133.5 ml LV vol s, MOD A2C: 117.0 ml LV vol s, MOD A4C: 115.0 ml LV SV MOD A2C:     11.0 ml LV SV MOD A4C:     133.5 ml LV SV MOD BP:      12.9 ml RIGHT VENTRICLE RV Basal diam:  2.50 cm RV S prime:  11.10 cm/s TAPSE (M-mode): 2.9 cm LEFT ATRIUM             Index       RIGHT ATRIUM           Index LA diam:        4.00 cm 2.09 cm/m  RA Area:     10.30 cm LA Vol (A2C):   43.6 ml 22.78 ml/m RA Volume:   19.30 ml  10.09 ml/m LA Vol (A4C):   56.1 ml 29.32 ml/m LA Biplane Vol: 52.0 ml 27.17 ml/m  AORTIC VALVE                   PULMONIC VALVE AV Area (Vmax):    1.62 cm    RVOT Peak grad: 3 mmHg AV Area (Vmean):   1.30 cm AV Area (VTI):     1.41 cm AV Vmax:           96.10 cm/s AV Vmean:          64.200 cm/s AV VTI:            0.160 m AV Peak Grad:      3.7 mmHg AV Mean Grad:      2.0 mmHg LVOT Vmax:         49.60 cm/s LVOT Vmean:         26.600 cm/s LVOT VTI:          0.072 m LVOT/AV VTI ratio: 0.45  AORTA Ao Root diam: 2.97 cm MITRAL VALVE               TRICUSPID VALVE MV Area (PHT): 8.16 cm    TR Peak grad:   22.1 mmHg MV Decel Time: 93 msec     TR Vmax:        235.00 cm/s MV E velocity: 43.70 cm/s MV A velocity: 75.40 cm/s  SHUNTS MV E/A ratio:  0.58        Systemic VTI:  0.07 m                            Systemic Diam: 2.00 cm Serafina Royals MD Electronically signed by Serafina Royals MD Signature Date/Time: 02/02/2021/2:10:50 PM    Final    CT HEAD CODE STROKE WO CONTRAST  Result Date: 01/31/2021 CLINICAL DATA:  Code stroke.  Right hemianopsia. EXAM: CT HEAD WITHOUT CONTRAST TECHNIQUE: Contiguous axial images were obtained from the base of the skull through the vertex without intravenous contrast. COMPARISON:  None. FINDINGS: Brain: Completed infarct in the left occipital lobe which is PCA branch territory and involves the visual cortex. No areas of prior infarct. No hemorrhage. Vascular: No hyperdense vessel or unexpected calcification. Skull: Normal. Negative for fracture or focal lesion. Sinuses/Orbits: No acute finding. Other: These results were called by telephone at the time of interpretation on 01/31/2021 at 9:23 am to provider [facility] , who verbally acknowledged these results. ASPECTS [facility] Stroke Program Early CT Score) Not scored with this history IMPRESSION: Acute infarct in the left occipital lobe. Electronically Signed   By: Monte Fantasia M.D.   On: 01/31/2021 09:24    EKG: Normal sinus rhythm with left atrial enlargement and left bundle branch block  Weights: Filed Weights   01/31/21 0847 01/31/21 0942  Weight: 81.6 kg 83.9 kg     Physical Exam: Blood pressure 128/79, pulse 74, temperature 98.6 F (37 C), temperature source Oral, resp. rate 16, height 5'  5" (1.651 m), weight 83.9 kg, SpO2 100 %. Body mass index is 30.79 kg/m. General: Well developed, well nourished, in no acute distress. Head eyes ears  nose throat: Normocephalic, atraumatic, sclera non-icteric, no xanthomas, nares are without discharge. No apparent thyromegaly and/or mass  Lungs: Normal respiratory effort.  no wheezes, no rales, no rhonchi.  Heart: RRR with normal S1 S2. no murmur gallop, no rub, PMI is normal size and placement, carotid upstroke normal without bruit, jugular venous pressure is normal Abdomen: Soft, non-tender, non-distended with normoactive bowel sounds. No hepatomegaly. No rebound/guarding. No obvious abdominal masses. Abdominal aorta is normal size without bruit Extremities: No edema. no cyanosis, no clubbing, no ulcers  Peripheral : 2+ bilateral upper extremity pulses, 2+ bilateral femoral pulses, 2+ bilateral dorsal pedal pulse Neuro: Alert and oriented. No facial asymmetry. No focal deficit. Moves all extremities spontaneously. Musculoskeletal: Normal muscle tone without kyphosis Psych:  Responds to questions appropriately with a normal affect.    Assessment: 65 year old female with diabetes hypertension hyperlipidemia and acute occipital stroke with concerns of secondary causes of the stroke including the possibility of rhythm disturbances cardiomyopathy and valvular heart disease or aortic atherosclerosis.  Plan: 1.  Continuation of medication management for cardiomyopathy LV systolic dysfunction and left bundle branch block including carvedilol and would consider the possibility of addition of angiotensin receptor blocker 2.  Dual antiplatelet therapy for stroke 3.  High intensity cholesterol therapy 4.  Proceed to transesophageal echocardiogram for further evaluation treatment options secondary causes of stroke.  Patient understands risk and benefits of transesophageal echocardiogram.  This includes the possibility of rhythm disturbances esophageal perforation sore throat medication management side effects.  She is at low risk for conscious sedation  Signed, Corey Skains M.D. [facility] 02/03/2021, 8:21 AM

## 2021-02-03 NOTE — CV Procedure (Signed)
Transesophageal echocardiogram preliminary report  Janice Richmond [id number] [birth year]  Preliminary diagnosis  Stroke with possible embolic source  Postprocedural diagnosis  Stroke with possible embolic source including spontaneous contrast of left atrium but no atrial thrombus and moderate aortic arch plaque  Time out A timeout was performed by the nursing staff and physicians specifically identifying the procedure performed, identification of the patient, the type of sedation, all allergies and medications, all pertinent medical history, and presedation assessment of nasopharynx. The patient and or family understand the risks of the procedure including the rare risks of death, stroke, heart attack, esophogeal perforation, sore throat, and reaction to medications given.  Moderate sedation During this procedure the patient has received Versed 4 milligrams and fentanyl 100 micrograms to achieve appropriate moderate sedation.  The patient had continued monitoring of heart rate, oxygenation, blood pressure, respiratory rate, and extent of signs of sedation throughout the entire procedure.  The patient received this moderate sedation over a period of 27 minutes.  Both the nursing staff and I were present during the procedure when the patient had moderate sedation for 100% of the time.  Treatment considerations continue current care for treatment of stroke  with antiplatelet and lipid mgt  For further details of transesophageal echocardiogram please refer to final report.  Signed,  Corey Skains M.D. [facility] 02/03/2021 1:44 PM

## 2021-02-03 NOTE — Progress Notes (Signed)
*  PRELIMINARY RESULTS* Echocardiogram Echocardiogram Transesophageal has been performed.  Janice Richmond 02/03/2021, 1:49 PM

## 2021-02-03 NOTE — Progress Notes (Signed)
*  PRELIMINARY RESULTS* Echocardiogram Echocardiogram Transesophageal has been performed.  Janice Richmond 02/03/2021, 1:48 PM

## 2021-02-03 NOTE — Progress Notes (Signed)
OT Cancellation Note  Patient Details Name: Janice Richmond MRN: [id number] DOB: [birth year]   Cancelled Treatment:    Reason Eval/Treat Not Completed: Patient at procedure or test/ unavailable. Pt out of room. Will re-attempt OT tx at later date/time as available and medically appropriate.  Hanley Hays, MPH, MS, OTR/L ascom [phone] 02/03/21, 12:44 PM

## 2021-02-03 NOTE — Progress Notes (Signed)
Brief Neurology Update  Cardiology consulted and will proceed with TEE. Will continue to follow.  Su Monks, MD [facility] [phone]  If 7pm- 7am, please page neurology on call as listed in [place].

## 2021-02-04 ENCOUNTER — Encounter: Payer: Self-pay | Admitting: Internal Medicine

## 2021-02-16 ENCOUNTER — Emergency Department: Payer: BC Managed Care – PPO

## 2021-02-16 ENCOUNTER — Emergency Department
Admission: EM | Admit: 2021-02-16 | Discharge: 2021-02-16 | Disposition: A | Discharge status: Home / Self Care | Payer: BC Managed Care – PPO | Attending: Emergency Medicine | Admitting: Emergency Medicine

## 2021-02-16 ENCOUNTER — Other Ambulatory Visit: Payer: Self-pay

## 2021-02-16 ENCOUNTER — Encounter: Payer: Self-pay | Admitting: Emergency Medicine

## 2021-02-16 DIAGNOSIS — E119 Type 2 diabetes mellitus without complications: Secondary | ICD-10-CM | POA: Insufficient documentation

## 2021-02-16 DIAGNOSIS — Z7982 Long term (current) use of aspirin: Secondary | ICD-10-CM | POA: Insufficient documentation

## 2021-02-16 DIAGNOSIS — Z7902 Long term (current) use of antithrombotics/antiplatelets: Secondary | ICD-10-CM | POA: Diagnosis not present

## 2021-02-16 DIAGNOSIS — Z87891 Personal history of nicotine dependence: Secondary | ICD-10-CM | POA: Insufficient documentation

## 2021-02-16 DIAGNOSIS — R42 Dizziness and giddiness: Secondary | ICD-10-CM | POA: Insufficient documentation

## 2021-02-16 DIAGNOSIS — I10 Essential (primary) hypertension: Secondary | ICD-10-CM | POA: Insufficient documentation

## 2021-02-16 DIAGNOSIS — Z79899 Other long term (current) drug therapy: Secondary | ICD-10-CM | POA: Diagnosis not present

## 2021-02-16 DIAGNOSIS — Z853 Personal history of malignant neoplasm of breast: Secondary | ICD-10-CM | POA: Diagnosis not present

## 2021-02-16 LAB — URINALYSIS, COMPLETE (UACMP) WITH MICROSCOPIC
Bilirubin Urine: NEGATIVE
Glucose, UA: NEGATIVE mg/dL
Ketones, ur: 5 mg/dL — AB
Leukocytes,Ua: NEGATIVE
Nitrite: NEGATIVE
Protein, ur: 30 mg/dL — AB
Specific Gravity, Urine: 1.011 (ref 1.005–1.030)
pH: 6 (ref 5.0–8.0)

## 2021-02-16 LAB — BASIC METABOLIC PANEL
Anion gap: 10 (ref 5–15)
BUN: 16 mg/dL (ref 8–23)
CO2: 23 mmol/L (ref 22–32)
Calcium: 9.3 mg/dL (ref 8.9–10.3)
Chloride: 103 mmol/L (ref 98–111)
Creatinine, Ser: 0.77 mg/dL (ref 0.44–1.00)
GFR, Estimated: >60 mL/min (ref >60)
Glucose, Bld: 175 mg/dL — ABNORMAL HIGH (ref 70–99)
Potassium: 4 mmol/L (ref 3.5–5.1)
Sodium: 136 mmol/L (ref 135–145)

## 2021-02-16 LAB — CBC
HCT: 39.1 % (ref 36.0–46.0)
Hemoglobin: 12.9 g/dL (ref 12.0–15.0)
MCH: 29.1 pg (ref 26.0–34.0)
MCHC: 33 g/dL (ref 30.0–36.0)
MCV: 88.3 fL (ref 80.0–100.0)
Platelets: 256 10*3/uL (ref 150–400)
RBC: 4.43 MIL/uL (ref 3.87–5.11)
RDW: 14.9 % (ref 11.5–15.5)
WBC: 6.2 10*3/uL (ref 4.0–10.5)
nRBC: 0 % (ref 0.0–0.2)

## 2021-02-16 LAB — TROPONIN I (HIGH SENSITIVITY): Troponin I (High Sensitivity): 43 ng/L — ABNORMAL HIGH (ref <18)

## 2021-02-16 LAB — PROTIME-INR
INR: 1.2 (ref 0.8–1.2)
Prothrombin Time: 14.9 seconds (ref 11.4–15.2)

## 2021-02-16 MED ORDER — SPIRONOLACTONE 25 MG PO TABS: 25.0000 mg | ORAL_TABLET | Freq: Every day | ORAL | 0 refills | Status: AC

## 2021-02-16 MED ORDER — SPIRONOLACTONE 25 MG PO TABS
25.0000 mg | ORAL_TABLET | Freq: Every day | ORAL | Status: DC
  Administered 2021-02-16: 25 mg via ORAL
  Filled 2021-02-16 (×2): qty 1

## 2021-02-16 NOTE — ED Triage Notes (Signed)
Pt c/o sudden onset of dizziness this evening at 1700, no other symptoms. Pt reports CVA on 3/27 with a remaining deficit of visual field change to the right eye. No drift, facial droop or sensation difference noted on triage neuro assessment. Pt did not receive TPA. Pt also reports she is taking Plavix and that her blood pressure medication was changed.  Pts BP 191/108

## 2021-02-20 NOTE — ED Provider Notes (Signed)
[facility] Emergency Department Provider Note   ____________________________________________   Event Date/Time   First MD Initiated Contact with Patient 02/16/21 2006     (approximate)  I have reviewed the triage vital signs and the nursing notes.   HISTORY  Chief Complaint Dizziness    HPI Janice Richmond is a 65 y.o. female with a stated past medical history of type 2 diabetes and previous TIA who presents reporting acute onset dizziness at approximately 1700 today.  Patient states that these symptoms lasted approximately 30 minutes and resolved spontaneously.  Patient is concerned as she has a history of TIA and is worried that she may be having another one.  Patient denies any other symptoms including facial droop, slurred speech, or weakness/numbness/paresthesias in any extremity         Past Medical History:  Diagnosis Date  . Breast cancer ([place]) 1993   left breast  . Diabetes mellitus without complication ([place])   . GERD (gastroesophageal reflux disease)   . HLD (hyperlipidemia)   . Hypertension     Patient Active Problem List   Diagnosis Date Noted  . Right homonymous hemianopsia   . Cardiomyopathy ([place])   . Type 2 diabetes mellitus with hyperlipidemia ([place])   . Stroke ([place]) 01/31/2021  . Diabetes mellitus without complication ([place])   . Essential hypertension   . HLD (hyperlipidemia)   . GERD (gastroesophageal reflux disease)     Past Surgical History:  Procedure Laterality Date  . MASTECTOMY Left 1993   mastectomy with chemotherapy  . TEE WITHOUT CARDIOVERSION N/A 02/03/2021   Procedure: TRANSESOPHAGEAL ECHOCARDIOGRAM (TEE);  Surgeon: Corey Skains, MD;  Location: [facility] ORS;  Service: Cardiovascular;  Laterality: N/A;    Prior to Admission medications   Medication Sig Start Date End Date Taking? Authorizing Provider  spironolactone (ALDACTONE) 25 MG tablet Take 1 tablet (25 mg total) by mouth daily. 02/16/21 03/18/21 Yes  Naaman Plummer, MD  aspirin EC 81 MG tablet Take 81 mg by mouth daily. Swallow whole.    [provider]  atorvastatin (LIPITOR) 40 MG tablet Take 1 tablet (40 mg total) by mouth daily. 02/04/21   Loletha Grayer, MD  carvedilol (COREG) 25 MG tablet Take 25 mg by mouth 2 (two) times daily. 01/15/21   [provider]  clopidogrel (PLAVIX) 75 MG tablet Take 1 tablet (75 mg total) by mouth daily. 02/04/21   Wieting, Richard, MD  JANUVIA 100 MG tablet Take 100 mg by mouth daily. 11/19/20   [provider]  latanoprost (XALATAN) 0.005 % ophthalmic solution Place 1 drop into both eyes at bedtime. 10/22/20   [provider]  losartan (COZAAR) 25 MG tablet Take 1 tablet (25 mg total) by mouth daily. 02/04/21   Loletha Grayer, MD  pantoprazole (PROTONIX) 40 MG tablet Take 1 tablet (40 mg total) by mouth daily. 02/04/21   Loletha Grayer, MD  timolol (TIMOPTIC) 0.5 % ophthalmic solution Place 1 drop into both eyes daily. 09/01/20   [provider]    Allergies Patient has no known allergies.  Family History  Problem Relation Age of Onset  . Breast cancer Other 40       did have BRCA testing and it was negative  . Pancreatic cancer Other 15       two nephews  . Breast cancer Sister 71       2 sisters, ages 14 and 17  . Pancreatic cancer Mother 4    Social History  Social History   Tobacco Use  . Smoking status: Former Research scientist (life sciences)  . Smokeless tobacco: Never Used  Substance Use Topics  . Alcohol use: Never  . Drug use: Never    Review of Systems Constitutional: No fever/chills Eyes: No visual changes. ENT: No sore throat. Cardiovascular: Denies chest pain. Respiratory: Denies shortness of breath. Gastrointestinal: No abdominal pain.  No nausea, no vomiting.  No diarrhea. Genitourinary: Negative for dysuria. Musculoskeletal: Negative for acute arthralgias Skin: Negative for rash. Neurological: Endorses dizziness.  Negative for headaches,  weakness/numbness/paresthesias in any extremity Psychiatric: Negative for suicidal ideation/homicidal ideation   ____________________________________________   PHYSICAL EXAM:  VITAL SIGNS: ED Triage Vitals  Enc Vitals Group     BP 02/16/21 1921 (!) 191/108     Pulse Rate 02/16/21 1921 97     Resp 02/16/21 1921 15     Temp 02/16/21 1921 98.3 F (36.8 C)     Temp Source 02/16/21 1921 Oral     SpO2 02/16/21 1921 95 %     Weight 02/16/21 1918 182 lb (82.6 kg)     Height --      Head Circumference --      Peak Flow --      Pain Score 02/16/21 1918 0     Pain Loc --      Pain Edu? --      Excl. in [place]? --    Constitutional: Alert and oriented. Well appearing and in no acute distress. Eyes: Conjunctivae are normal. PERRL. Head: Atraumatic. Nose: No congestion/rhinnorhea. Mouth/Throat: Mucous membranes are moist. Neck: No stridor Cardiovascular: Grossly normal heart sounds.  Good peripheral circulation. Respiratory: Normal respiratory effort.  No retractions. Gastrointestinal: Soft and nontender. No distention. Musculoskeletal: No obvious deformities Neurologic:  Normal speech and language. No gross focal neurologic deficits are appreciated. Skin:  Skin is warm and dry. No rash noted. Psychiatric: Mood and affect are normal. Speech and behavior are normal.  ____________________________________________   LABS (all labs ordered are listed, but only abnormal results are displayed)  Labs Reviewed  BASIC METABOLIC PANEL - Abnormal; Notable for the following components:      Result Value   Glucose, Bld 175 (*)    All other components within normal limits  URINALYSIS, COMPLETE (UACMP) WITH MICROSCOPIC - Abnormal; Notable for the following components:   Color, Urine YELLOW (*)    APPearance HAZY (*)    Hgb urine dipstick SMALL (*)    Ketones, ur 5 (*)    Protein, ur 30 (*)    Bacteria, UA RARE (*)    All other components within normal limits  TROPONIN I (HIGH SENSITIVITY) -  Abnormal; Notable for the following components:   Troponin I (High Sensitivity) 43 (*)    All other components within normal limits  CBC  PROTIME-INR   ____________________________________________  EKG  ED ECG REPORT I, Naaman Plummer, the attending physician, personally viewed and interpreted this ECG.  Date: 02/16/2021 EKG Time: 1921 Rate: 97 Rhythm: normal sinus rhythm QRS Axis: normal Intervals: normal ST/T Wave abnormalities: normal Narrative Interpretation: no evidence of acute ischemia  ____________________________________________  RADIOLOGY  ED MD interpretation: CT without contrast of the head shows normal progression of previous stroke without any evidence of new hemorrhage or ischemia  Official radiology report(s): No results found.  ____________________________________________   PROCEDURES  Procedure(s) performed (including Critical Care):  .1-3 Lead EKG Interpretation Performed by: Naaman Plummer, MD Authorized by: Naaman Plummer, MD     Interpretation: normal  ECG rate:  87   ECG rate assessment: normal     Rhythm: sinus rhythm     Ectopy: none     Conduction: normal       ____________________________________________   INITIAL IMPRESSION / ASSESSMENT AND PLAN / ED COURSE  As part of my medical decision making, I reviewed the following data within the [place] notes reviewed and incorporated, Labs reviewed, EKG interpreted, Old chart reviewed, Radiograph reviewed and Notes from prior ED visits reviewed and incorporated        Based on History, Exam, and Findings, presentation not consistent with syncope, seizure, stroke, meningitis, symptomatic anemia (gastrointestinal bleed), Increased ICP (cerebral tumor/mass), ICH. Additionally, I have a low suspicion for AOM, labyrinthitis, or other infectious process.  Reassessment: Prior to discharge symptoms controlled, patient well appearing. Disposition:  Discharge.  Strict return precautions discussed w/ full understanding. Advise follow up with primary care provider within 24-48 hours.      ____________________________________________   FINAL CLINICAL IMPRESSION(S) / ED DIAGNOSES  Final diagnoses:  Lightheadedness  Primary hypertension     ED Discharge Orders         Ordered    spironolactone (ALDACTONE) 25 MG tablet  Daily        02/16/21 2154           Note:  This document was prepared using Dragon voice recognition software and may include unintentional dictation errors.   Naaman Plummer, MD 02/20/21 [phone]

## 2021-04-28 ENCOUNTER — Encounter: Payer: Self-pay | Admitting: *Deleted

## 2021-04-28 ENCOUNTER — Other Ambulatory Visit: Payer: Self-pay

## 2021-04-28 DIAGNOSIS — E785 Hyperlipidemia, unspecified: Secondary | ICD-10-CM | POA: Insufficient documentation

## 2021-04-28 DIAGNOSIS — E1169 Type 2 diabetes mellitus with other specified complication: Secondary | ICD-10-CM | POA: Diagnosis not present

## 2021-04-28 DIAGNOSIS — Z7902 Long term (current) use of antithrombotics/antiplatelets: Secondary | ICD-10-CM | POA: Insufficient documentation

## 2021-04-28 DIAGNOSIS — Z79899 Other long term (current) drug therapy: Secondary | ICD-10-CM | POA: Insufficient documentation

## 2021-04-28 DIAGNOSIS — Z7982 Long term (current) use of aspirin: Secondary | ICD-10-CM | POA: Insufficient documentation

## 2021-04-28 DIAGNOSIS — Z87891 Personal history of nicotine dependence: Secondary | ICD-10-CM | POA: Diagnosis not present

## 2021-04-28 DIAGNOSIS — I10 Essential (primary) hypertension: Secondary | ICD-10-CM | POA: Diagnosis not present

## 2021-04-28 DIAGNOSIS — B9689 Other specified bacterial agents as the cause of diseases classified elsewhere: Secondary | ICD-10-CM | POA: Diagnosis not present

## 2021-04-28 DIAGNOSIS — N390 Urinary tract infection, site not specified: Secondary | ICD-10-CM | POA: Diagnosis not present

## 2021-04-28 DIAGNOSIS — R42 Dizziness and giddiness: Secondary | ICD-10-CM | POA: Diagnosis present

## 2021-04-28 DIAGNOSIS — Z853 Personal history of malignant neoplasm of breast: Secondary | ICD-10-CM | POA: Insufficient documentation

## 2021-04-28 LAB — CBC
HCT: 40.1 % (ref 36.0–46.0)
Hemoglobin: 13.4 g/dL (ref 12.0–15.0)
MCH: 29.8 pg (ref 26.0–34.0)
MCHC: 33.4 g/dL (ref 30.0–36.0)
MCV: 89.3 fL (ref 80.0–100.0)
Platelets: 233 10*3/uL (ref 150–400)
RBC: 4.49 MIL/uL (ref 3.87–5.11)
RDW: 15.5 % (ref 11.5–15.5)
WBC: 7.4 10*3/uL (ref 4.0–10.5)
nRBC: 0 % (ref 0.0–0.2)

## 2021-04-28 LAB — BASIC METABOLIC PANEL
Anion gap: 7 (ref 5–15)
BUN: 14 mg/dL (ref 8–23)
CO2: 23 mmol/L (ref 22–32)
Calcium: 9.2 mg/dL (ref 8.9–10.3)
Chloride: 106 mmol/L (ref 98–111)
Creatinine, Ser: 0.63 mg/dL (ref 0.44–1.00)
GFR, Estimated: >60 mL/min (ref >60)
Glucose, Bld: 188 mg/dL — ABNORMAL HIGH (ref 70–99)
Potassium: 3.9 mmol/L (ref 3.5–5.1)
Sodium: 136 mmol/L (ref 135–145)

## 2021-04-28 NOTE — ED Triage Notes (Signed)
Pt states she has felt lightheaded for about an hour. "Tightness" feeling in her head. No nausea or vomiting. Taking her BP meds as prescribed, reports her doctor has been adjusting her medications recently. Clear speech, mae x 4.

## 2021-04-29 ENCOUNTER — Emergency Department
Admission: EM | Admit: 2021-04-29 | Discharge: 2021-04-29 | Disposition: A | Discharge status: Home / Self Care | Payer: BC Managed Care – PPO | Attending: Emergency Medicine | Admitting: Emergency Medicine

## 2021-04-29 ENCOUNTER — Emergency Department: Payer: BC Managed Care – PPO

## 2021-04-29 DIAGNOSIS — N390 Urinary tract infection, site not specified: Secondary | ICD-10-CM

## 2021-04-29 DIAGNOSIS — R42 Dizziness and giddiness: Secondary | ICD-10-CM

## 2021-04-29 LAB — URINALYSIS, COMPLETE (UACMP) WITH MICROSCOPIC
Bilirubin Urine: NEGATIVE
Glucose, UA: 50 mg/dL — AB
Ketones, ur: NEGATIVE mg/dL
Nitrite: NEGATIVE
Protein, ur: 30 mg/dL — AB
Specific Gravity, Urine: 1.013 (ref 1.005–1.030)
pH: 8 (ref 5.0–8.0)

## 2021-04-29 MED ORDER — MECLIZINE HCL 25 MG PO TABS
12.5000 mg | ORAL_TABLET | Freq: Once | ORAL | Status: AC
  Administered 2021-04-29: 12.5 mg via ORAL
  Filled 2021-04-29: qty 1

## 2021-04-29 MED ORDER — CEPHALEXIN 500 MG PO CAPS: 500.0000 mg | ORAL_CAPSULE | Freq: Two times a day (BID) | ORAL | 0 refills | Status: AC

## 2021-04-29 MED ORDER — CEPHALEXIN 500 MG PO CAPS
500.0000 mg | ORAL_CAPSULE | Freq: Once | ORAL | Status: AC
  Administered 2021-04-29: 500 mg via ORAL
  Filled 2021-04-29: qty 1

## 2021-04-29 NOTE — Discharge Instructions (Addendum)
Take the antibiotic as prescribed and finish the full 1 week course.  Return to the ER for new, worsening, or persistent severe dizziness, lightheadedness, fever, weakness, changes in your vision, severe headache, vomiting, or any other new or worsening symptoms that concern you.    Follow-up with your primary care doctor.

## 2021-04-29 NOTE — ED Provider Notes (Signed)
[facility] Emergency Department Provider Note ____________________________________________   Event Date/Time   First MD Initiated Contact with Patient 04/29/21 0209     (approximate)  I have reviewed the triage vital signs and the nursing notes.   HISTORY  Chief Complaint Dizziness    HPI Janice Richmond is a 65 y.o. female with PMH including hypertension, hyperlipidemia, diabetes, cardiomyopathy and stroke who presents with dizziness, acute onset several hours ago, described primarily as lightheadedness but also with an element of spinning or movement.  She states it was worse when she stood up or tried to walk around, and improved with being still or lying down.  She states it is now mostly resolved.  She denies associated headache, vision changes, nausea or vomiting, chest pain, difficulty breathing, palpitations, or other acute symptoms.  The patient states that she has had similar symptoms before that resolved.  She believes it is due to her high blood pressure and states that she checked her blood pressure at home and the bottom number was over 100 although she does not remember the exact reading.  Past Medical History:  Diagnosis Date   Breast cancer ([place]) 1993   left breast   Diabetes mellitus without complication ([place])    GERD (gastroesophageal reflux disease)    HLD (hyperlipidemia)    Hypertension     Patient Active Problem List   Diagnosis Date Noted   Right homonymous hemianopsia    Cardiomyopathy ([place])    Type 2 diabetes mellitus with hyperlipidemia ([place])    Stroke ([place]) 01/31/2021   Diabetes mellitus without complication ([facility])    Essential hypertension    HLD (hyperlipidemia)    GERD (gastroesophageal reflux disease)     Past Surgical History:  Procedure Laterality Date   MASTECTOMY Left 1993   mastectomy with chemotherapy   TEE WITHOUT CARDIOVERSION N/A 02/03/2021   Procedure: TRANSESOPHAGEAL ECHOCARDIOGRAM (TEE);  Surgeon:  Corey Skains, MD;  Location: [facility] ORS;  Service: Cardiovascular;  Laterality: N/A;    Prior to Admission medications   Medication Sig Start Date End Date Taking? Authorizing Provider  cephALEXin (KEFLEX) 500 MG capsule Take 1 capsule (500 mg total) by mouth 2 (two) times daily for 7 days. 04/29/21 05/06/21 Yes Arta Silence, MD  aspirin EC 81 MG tablet Take 81 mg by mouth daily. Swallow whole.    [provider]  atorvastatin (LIPITOR) 40 MG tablet Take 1 tablet (40 mg total) by mouth daily. 02/04/21   Loletha Grayer, MD  carvedilol (COREG) 25 MG tablet Take 25 mg by mouth 2 (two) times daily. 01/15/21   [provider]  clopidogrel (PLAVIX) 75 MG tablet Take 1 tablet (75 mg total) by mouth daily. 02/04/21   Wieting, Richard, MD  JANUVIA 100 MG tablet Take 100 mg by mouth daily. 11/19/20   [provider]  latanoprost (XALATAN) 0.005 % ophthalmic solution Place 1 drop into both eyes at bedtime. 10/22/20   [provider]  losartan (COZAAR) 25 MG tablet Take 1 tablet (25 mg total) by mouth daily. 02/04/21   Loletha Grayer, MD  pantoprazole (PROTONIX) 40 MG tablet Take 1 tablet (40 mg total) by mouth daily. 02/04/21   Loletha Grayer, MD  spironolactone (ALDACTONE) 25 MG tablet Take 1 tablet (25 mg total) by mouth daily. 02/16/21 03/18/21  Naaman Plummer, MD  timolol (TIMOPTIC) 0.5 % ophthalmic solution Place 1 drop into both eyes daily. 09/01/20   [provider]    Allergies Patient has  no known allergies.  Family History  Problem Relation Age of Onset   Breast cancer Other 45       did have BRCA testing and it was negative   Pancreatic cancer Other 24       two nephews   Breast cancer Sister 34       2 sisters, ages 64 and 32   Pancreatic cancer Mother 76    Social History Social History   Tobacco Use   Smoking status: Former    Pack years: 0.00   Smokeless tobacco: Never  Substance Use Topics   Alcohol use: Never   Drug  use: Never    Review of Systems  Constitutional: No fever. Eyes: No visual changes. ENT: No neck pain. Cardiovascular: Denies chest pain. Respiratory: Denies shortness of breath. Gastrointestinal: No nausea or vomiting. Genitourinary: Positive for mild dysuria and frequency. Musculoskeletal: Negative for back pain. Skin: Negative for rash. Neurological: Negative for headache, focal weakness or numbness.   ____________________________________________   PHYSICAL EXAM:  VITAL SIGNS: ED Triage Vitals  Enc Vitals Group     BP 04/28/21 2253 (!) 164/101     Pulse Rate 04/28/21 2253 85     Resp 04/28/21 2253 16     Temp 04/28/21 2253 98 F (36.7 C)     Temp Source 04/28/21 2253 Oral     SpO2 04/28/21 2253 100 %     Weight --      Height --      Head Circumference --      Peak Flow --      Pain Score 04/28/21 2251 4     Pain Loc --      Pain Edu? --      Excl. in [place]? --     Constitutional: Alert and oriented. Well appearing and in no acute distress. Eyes: Conjunctivae are normal.  EOMI.  PERRLA. Head: Atraumatic. Nose: No congestion/rhinnorhea. Mouth/Throat: Mucous membranes are moist.   Neck: Normal range of motion.  Cardiovascular: Normal rate, regular rhythm. Grossly normal heart sounds.  Good peripheral circulation. Respiratory: Normal respiratory effort.  No retractions. Lungs CTAB. Gastrointestinal: No distention.  Musculoskeletal: Extremities warm and well perfused.  Neurologic:  Normal speech and language.  Motor and sensory intact in all extremities.  Normal coordination with no ataxia on finger-to-nose.  No pronator drift.  No facial droop. Skin:  Skin is warm and dry. No rash noted. Psychiatric: Mood and affect are normal. Speech and behavior are normal.  ____________________________________________   LABS (all labs ordered are listed, but only abnormal results are displayed)  Labs Reviewed  BASIC METABOLIC PANEL - Abnormal; Notable for the following  components:      Result Value   Glucose, Bld 188 (*)    All other components within normal limits  URINALYSIS, COMPLETE (UACMP) WITH MICROSCOPIC - Abnormal; Notable for the following components:   Color, Urine YELLOW (*)    APPearance HAZY (*)    Glucose, UA 50 (*)    Hgb urine dipstick SMALL (*)    Protein, ur 30 (*)    Leukocytes,Ua SMALL (*)    Bacteria, UA FEW (*)    All other components within normal limits  CBC   ____________________________________________  EKG  ED ECG REPORT I, Arta Silence, the attending physician, personally viewed and interpreted this ECG.  Date: 04/29/2021 EKG Time: 2256 Rate: 81 Rhythm: normal sinus rhythm QRS Axis: normal Intervals: LBBB ST/T Wave abnormalities: normal Narrative Interpretation: no evidence of acute  ischemia; no significant change when compared to EKG of 02/16/2021  ____________________________________________  RADIOLOGY  CT head: No ICH, acute infarct, or other acute abnormality  ____________________________________________   PROCEDURES  Procedure(s) performed: No  Procedures  Critical Care performed: No ____________________________________________   INITIAL IMPRESSION / ASSESSMENT AND PLAN / ED COURSE  Pertinent labs & imaging results that were available during my care of the patient were reviewed by me and considered in my medical decision making (see chart for details).   65 year old female with PMH as noted above including hypertension, hyperlipidemia, diabetes, cardiomyopathy and stroke presents with dizziness over the last few hours primarily described as lightheadedness and now mostly resolved.  Review of systems is generally negative although she does endorse some dysuria.  I reviewed the past medical records in [place]. The patient was admitted in March with an acute stroke caused by a left PCA territory infarction causing decreased vision in her right eye.  She was seen in the ED on 4/12 with an  episode of dizziness and a negative work-up at that time.  She states that the symptoms today are somewhat similar to what she had at that visit.  On exam the patient is overall well-appearing.  Her vital signs are normal except for mild hypertension.  Neurologic exam is nonfocal.  The physical exam is otherwise unremarkable.  Differential includes dehydration, electrolyte abnormality, other metabolic etiology, UTI or other infection, peripheral vertigo, or less likely acute CNS cause.  Given the lack of any neurologic deficits I have a low suspicion for acute stroke or TIA.  The patient has no chest pain, palpitations, or other signs or symptoms to suggest cardiac etiology.  Her EKG is unchanged from prior.  Initial basic labs from triage are unremarkable.  However, the urinalysis does show significant WBCs and some bacteria suggestive of possible UTI which goes along with the dysuria.  UTI could also certainly cause the patient's lightheadedness.  Given the patient's relatively recent stroke history we will obtain a CT head to rule out any acute abnormality.  I will also give meclizine for symptomatic treatment in case this could be vertigo.  If this is negative I anticipate discharge home.  ----------------------------------------- 4:43 AM on 04/29/2021 -----------------------------------------  CT head is negative for acute findings.  The patient states she feels comfortable.  She is stable for discharge home at this time.  I counseled her on the results of her work-up and the plan of care.  I will prescribe a 1 week course of Keflex for UTI.  Return precautions given, and she expresses understanding.  ____________________________________________   FINAL CLINICAL IMPRESSION(S) / ED DIAGNOSES  Final diagnoses:  Dizziness  Urinary tract infection without hematuria, site unspecified      NEW MEDICATIONS STARTED DURING THIS VISIT:  New Prescriptions   CEPHALEXIN (KEFLEX) 500 MG CAPSULE     Take 1 capsule (500 mg total) by mouth 2 (two) times daily for 7 days.     Note:  This document was prepared using Dragon voice recognition software and may include unintentional dictation errors.    Arta Silence, MD 04/29/21 [phone]

## 2021-04-29 NOTE — ED Notes (Signed)
Patient transported to CT 

## 2021-05-05 ENCOUNTER — Other Ambulatory Visit: Payer: Self-pay | Admitting: Family Medicine

## 2021-05-05 DIAGNOSIS — Z1231 Encounter for screening mammogram for malignant neoplasm of breast: Secondary | ICD-10-CM

## 2021-06-13 ENCOUNTER — Other Ambulatory Visit: Payer: Self-pay

## 2021-06-13 ENCOUNTER — Emergency Department
Admission: EM | Admit: 2021-06-13 | Discharge: 2021-06-13 | Disposition: A | Discharge status: Home / Self Care | Payer: BC Managed Care – PPO | Attending: Emergency Medicine | Admitting: Emergency Medicine

## 2021-06-13 ENCOUNTER — Encounter: Payer: Self-pay | Admitting: Emergency Medicine

## 2021-06-13 DIAGNOSIS — K047 Periapical abscess without sinus: Secondary | ICD-10-CM | POA: Insufficient documentation

## 2021-06-13 DIAGNOSIS — I10 Essential (primary) hypertension: Secondary | ICD-10-CM | POA: Insufficient documentation

## 2021-06-13 DIAGNOSIS — Z7982 Long term (current) use of aspirin: Secondary | ICD-10-CM | POA: Diagnosis not present

## 2021-06-13 DIAGNOSIS — Z7902 Long term (current) use of antithrombotics/antiplatelets: Secondary | ICD-10-CM | POA: Insufficient documentation

## 2021-06-13 DIAGNOSIS — Z87891 Personal history of nicotine dependence: Secondary | ICD-10-CM | POA: Diagnosis not present

## 2021-06-13 DIAGNOSIS — Z853 Personal history of malignant neoplasm of breast: Secondary | ICD-10-CM | POA: Insufficient documentation

## 2021-06-13 DIAGNOSIS — K0889 Other specified disorders of teeth and supporting structures: Secondary | ICD-10-CM | POA: Diagnosis present

## 2021-06-13 DIAGNOSIS — E119 Type 2 diabetes mellitus without complications: Secondary | ICD-10-CM | POA: Insufficient documentation

## 2021-06-13 MED ORDER — HYDROCODONE-ACETAMINOPHEN 5-325 MG PO TABS: 1.0000 | ORAL_TABLET | Freq: Four times a day (QID) | ORAL | 0 refills | Status: DC | PRN

## 2021-06-13 MED ORDER — AMOXICILLIN 875 MG PO TABS: 875.0000 mg | ORAL_TABLET | Freq: Two times a day (BID) | ORAL | 0 refills | Status: DC

## 2021-06-13 NOTE — ED Triage Notes (Signed)
Pt to ED via POV, states bit down on a bone Friday night, now c/o pain and swelling to R upper jaw and dental pain to teeth.

## 2021-06-13 NOTE — Discharge Instructions (Addendum)
Follow-up with your regular dentist.  Please call for an appointment Take the antibiotic as prescribed.  Apply a cool compress to the right side of your face.  Return emergency department for worsening.

## 2021-06-13 NOTE — ED Notes (Signed)
See triage note  Presents with possible dental pain states pain occurred after biting down on a bone

## 2021-06-13 NOTE — ED Provider Notes (Signed)
[facility] Emergency Department Provider Note  ____________________________________________   Event Date/Time   First MD Initiated Contact with Patient 06/13/21 0809     (approximate)  I have reviewed the triage vital signs and the nursing notes.   HISTORY  Chief Complaint Dental Pain    HPI SARAHELIZABETH CONWAY is a 65 y.o. female presents emergency department complaint of right-sided facial swelling.  Patient states he bit down on a [place] bone started to have pain in the upper teeth and swelling.  History of dental caries.  Has a regular dentist.  No fever or chills.  No chest pain or shortness of breath  Past Medical History:  Diagnosis Date   Breast cancer ([place]) 1993   left breast   Diabetes mellitus without complication ([place])    GERD (gastroesophageal reflux disease)    HLD (hyperlipidemia)    Hypertension     Patient Active Problem List   Diagnosis Date Noted   Right homonymous hemianopsia    Cardiomyopathy ([place])    Type 2 diabetes mellitus with hyperlipidemia ([place])    Stroke ([place]) 01/31/2021   Diabetes mellitus without complication (H[facility])    Essential hypertension    HLD (hyperlipidemia)    GERD (gastroesophageal reflux disease)     Past Surgical History:  Procedure Laterality Date   MASTECTOMY Left 1993   mastectomy with chemotherapy   TEE WITHOUT CARDIOVERSION N/A 02/03/2021   Procedure: TRANSESOPHAGEAL ECHOCARDIOGRAM (TEE);  Surgeon: Corey Skains, MD;  Location: [facility] ORS;  Service: Cardiovascular;  Laterality: N/A;    Prior to Admission medications   Medication Sig Start Date End Date Taking? Authorizing Provider  HYDROcodone-acetaminophen (NORCO/VICODIN) 5-325 MG tablet Take 1 tablet by mouth every 6 (six) hours as needed for moderate pain. 06/13/21  Yes Shawnte Demarest, Linden Dolin, PA-C  amoxicillin (AMOXIL) 875 MG tablet Take 1 tablet (875 mg total) by mouth 2 (two) times daily. 06/13/21   Caryn Section Linden Dolin, PA-C  aspirin EC 81 MG tablet  Take 81 mg by mouth daily. Swallow whole.    [provider]  atorvastatin (LIPITOR) 40 MG tablet Take 1 tablet (40 mg total) by mouth daily. 02/04/21   Loletha Grayer, MD  carvedilol (COREG) 25 MG tablet Take 25 mg by mouth 2 (two) times daily. 01/15/21   [provider]  clopidogrel (PLAVIX) 75 MG tablet Take 1 tablet (75 mg total) by mouth daily. 02/04/21   Wieting, Richard, MD  JANUVIA 100 MG tablet Take 100 mg by mouth daily. 11/19/20   [provider]  latanoprost (XALATAN) 0.005 % ophthalmic solution Place 1 drop into both eyes at bedtime. 10/22/20   [provider]  losartan (COZAAR) 25 MG tablet Take 1 tablet (25 mg total) by mouth daily. 02/04/21   Loletha Grayer, MD  pantoprazole (PROTONIX) 40 MG tablet Take 1 tablet (40 mg total) by mouth daily. 02/04/21   Loletha Grayer, MD  spironolactone (ALDACTONE) 25 MG tablet Take 1 tablet (25 mg total) by mouth daily. 02/16/21 03/18/21  Naaman Plummer, MD  timolol (TIMOPTIC) 0.5 % ophthalmic solution Place 1 drop into both eyes daily. 09/01/20   [provider]    Allergies Patient has no known allergies.  Family History  Problem Relation Age of Onset   Breast cancer Other 31       did have BRCA testing and it was negative   Pancreatic cancer Other 67       two nephews   Breast cancer Sister 72  2 sisters, ages 62 and 50   Pancreatic cancer Mother 14    Social History Social History   Tobacco Use   Smoking status: Former   Smokeless tobacco: Never  Substance Use Topics   Alcohol use: Never   Drug use: Never    Review of Systems  Constitutional: No fever/chills Eyes: No visual changes. ENT: No sore throat.  Positive dental pain and facial swelling Respiratory: Denies cough Cardiovascular: Denies chest pain Gastrointestinal: Denies abdominal pain Genitourinary: Negative for dysuria. Musculoskeletal: Negative for back pain. Skin: Negative for rash. Psychiatric: no mood  changes,     ____________________________________________   PHYSICAL EXAM:  VITAL SIGNS: ED Triage Vitals  Enc Vitals Group     BP 06/13/21 0652 (!) 141/98     Pulse Rate 06/13/21 0652 85     Resp 06/13/21 0652 18     Temp 06/13/21 0652 98.6 F (37 C)     Temp Source 06/13/21 0652 Oral     SpO2 06/13/21 0652 98 %     Weight 06/13/21 0653 166 lb (75.3 kg)     Height 06/13/21 0653 '5\' 4"'  (1.626 m)     Head Circumference --      Peak Flow --      Pain Score 06/13/21 0653 2     Pain Loc --      Pain Edu? --      Excl. in [place]? --     Constitutional: Alert and oriented. Well appearing and in no acute distress. Eyes: Conjunctivae are normal.  Head: Atraumatic.  Positive right-sided facial swelling Nose: No congestion/rhinnorhea. Mouth/Throat: Mucous membranes are moist.  Swollen tender area noted in the buccal mucosa leading to the right upper molars Neck:  supple no lymphadenopathy noted Cardiovascular: Normal rate, regular rhythm. Heart sounds are normal Respiratory: Normal respiratory effort.  No retractions, lungs c t a  GU: deferred Musculoskeletal: FROM all extremities, warm and well perfused Neurologic:  Normal speech and language.  Skin:  Skin is warm, dry and intact. No rash noted. Psychiatric: Mood and affect are normal. Speech and behavior are normal.  ____________________________________________   LABS (all labs ordered are listed, but only abnormal results are displayed)  Labs Reviewed - No data to display ____________________________________________   ____________________________________________  RADIOLOGY    ____________________________________________   PROCEDURES  Procedure(s) performed: No  Procedures    ____________________________________________   INITIAL IMPRESSION / ASSESSMENT AND PLAN / ED COURSE  Pertinent labs & imaging results that were available during my care of the patient were reviewed by me and considered in my medical  decision making (see chart for details).   Patient 65 year old female presents emergency department with dental pain.  See HPI.  Physical exam shows dental abscess.  Patient was given a prescription for amoxicillin and Vicodin.  She is on a blood thinner so do not want her on additional anti-inflammatories.  She is to follow-up with her regular dentist.  Return emergency department worsening.  She is discharged stable condition.     MARYANNA STUBER was evaluated in Emergency Department on 06/13/2021 for the symptoms described in the history of present illness. She was evaluated in the context of the global COVID-19 pandemic, which necessitated consideration that the patient might be at risk for infection with the SARS-CoV-2 virus that causes COVID-19. Institutional protocols and algorithms that pertain to the evaluation of patients at risk for COVID-19 are in a state of rapid change based on information released by regulatory bodies including  the [employer] and federal and state organizations. These policies and algorithms were followed during the patient's care in the ED.    As part of my medical decision making, I reviewed the following data within the [place] notes reviewed and incorporated, Old chart reviewed, Notes from prior ED visits, and [place] Controlled Substance Database  ____________________________________________   FINAL CLINICAL IMPRESSION(S) / ED DIAGNOSES  Final diagnoses:  Dental abscess      NEW MEDICATIONS STARTED DURING THIS VISIT:  New Prescriptions   AMOXICILLIN (AMOXIL) 875 MG TABLET    Take 1 tablet (875 mg total) by mouth 2 (two) times daily.   HYDROCODONE-ACETAMINOPHEN (NORCO/VICODIN) 5-325 MG TABLET    Take 1 tablet by mouth every 6 (six) hours as needed for moderate pain.     Note:  This document was prepared using Dragon voice recognition software and may include unintentional dictation errors.    Versie Starks, PA-C 06/13/21 0902     Harvest Dark, MD 06/14/21 [phone]

## 2021-06-17 ENCOUNTER — Ambulatory Visit
Admission: RE | Admit: 2021-06-17 | Discharge: 2021-06-17 | Disposition: A | Discharge status: Home / Self Care | Payer: BC Managed Care – PPO | Source: Ambulatory Visit | Attending: Family Medicine | Admitting: Family Medicine

## 2021-06-17 ENCOUNTER — Other Ambulatory Visit: Payer: Self-pay

## 2021-06-17 DIAGNOSIS — Z1231 Encounter for screening mammogram for malignant neoplasm of breast: Secondary | ICD-10-CM | POA: Diagnosis not present

## 2021-10-02 IMAGING — MG MM DIGITAL SCREENING UNILAT*R* W/ TOMO W/ CAD
6 series · 6 of 18 positions shown · non-contrast
Comparison: Previous exam(s).

CLINICAL DATA: Screening.

EXAM:
DIGITAL SCREENING UNILATERAL RIGHT MAMMOGRAM WITH CAD AND
TOMOSYNTHESIS
TECHNIQUE: Right screening digital craniocaudal and mediolateral oblique
mammograms were obtained. Right screening digital breast
tomosynthesis was performed. The images were evaluated with
computer-aided detection.

[R CC synth-2D]
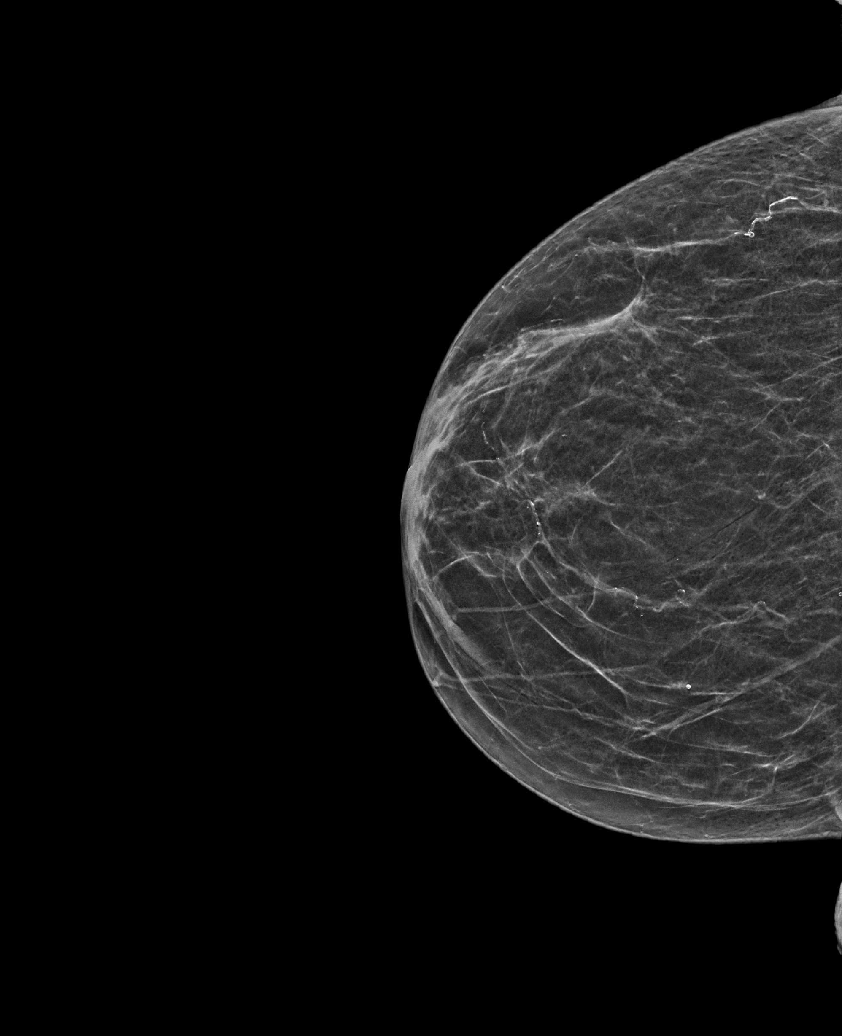

[R MLO synth-2D (1 of 2)]
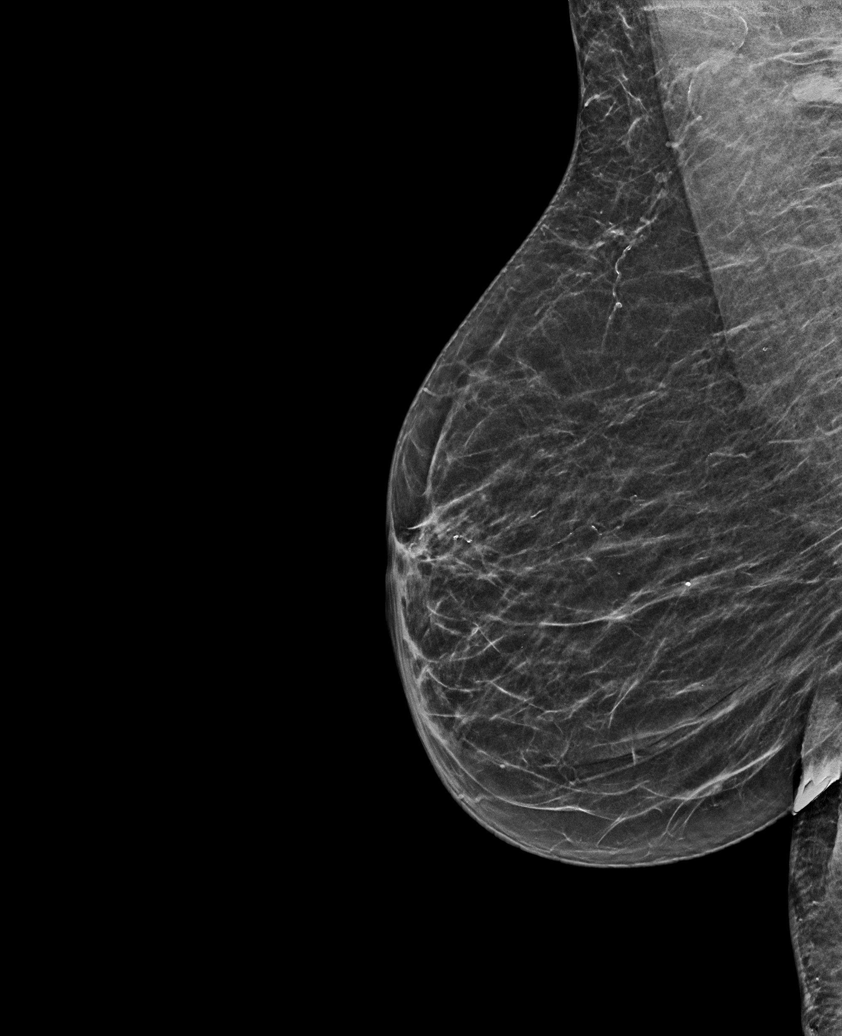

[R MLO synth-2D (2 of 2)]
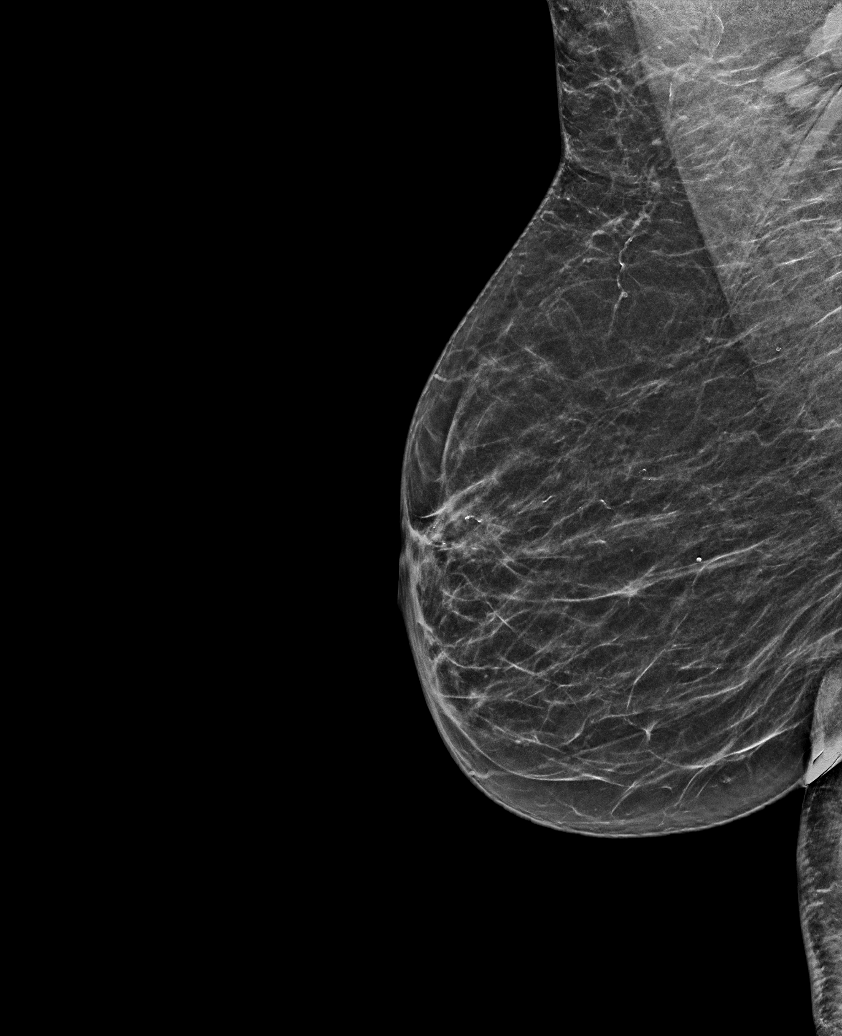

[R MLO tomo (1 of 2) · tomo slice 27/54.0]
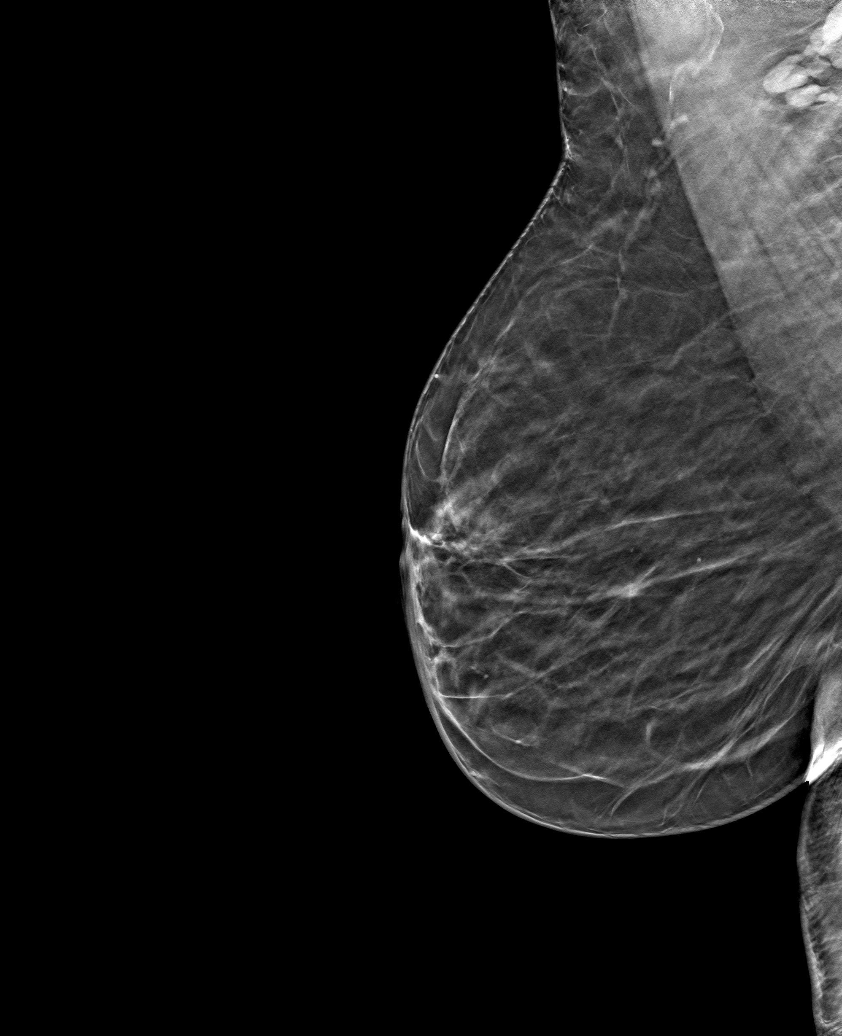

[R CC tomo · tomo slice 23/46.0]
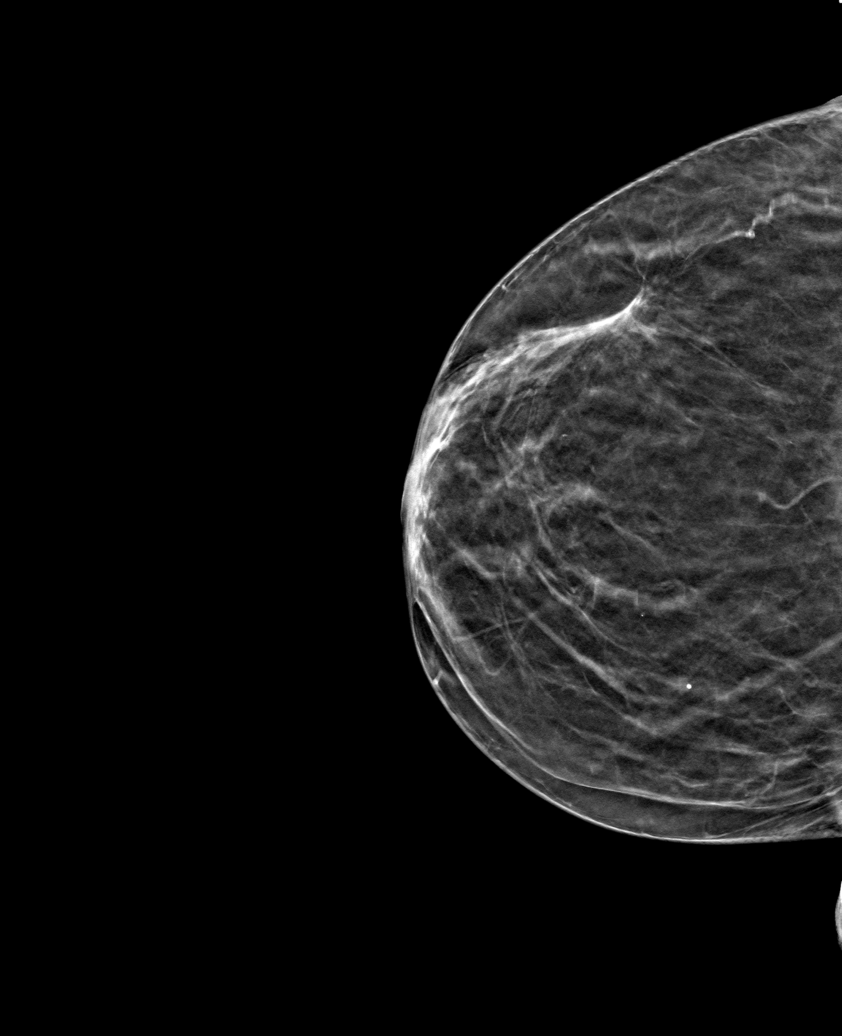

[R MLO tomo (2 of 2) · tomo slice 27/52.0]
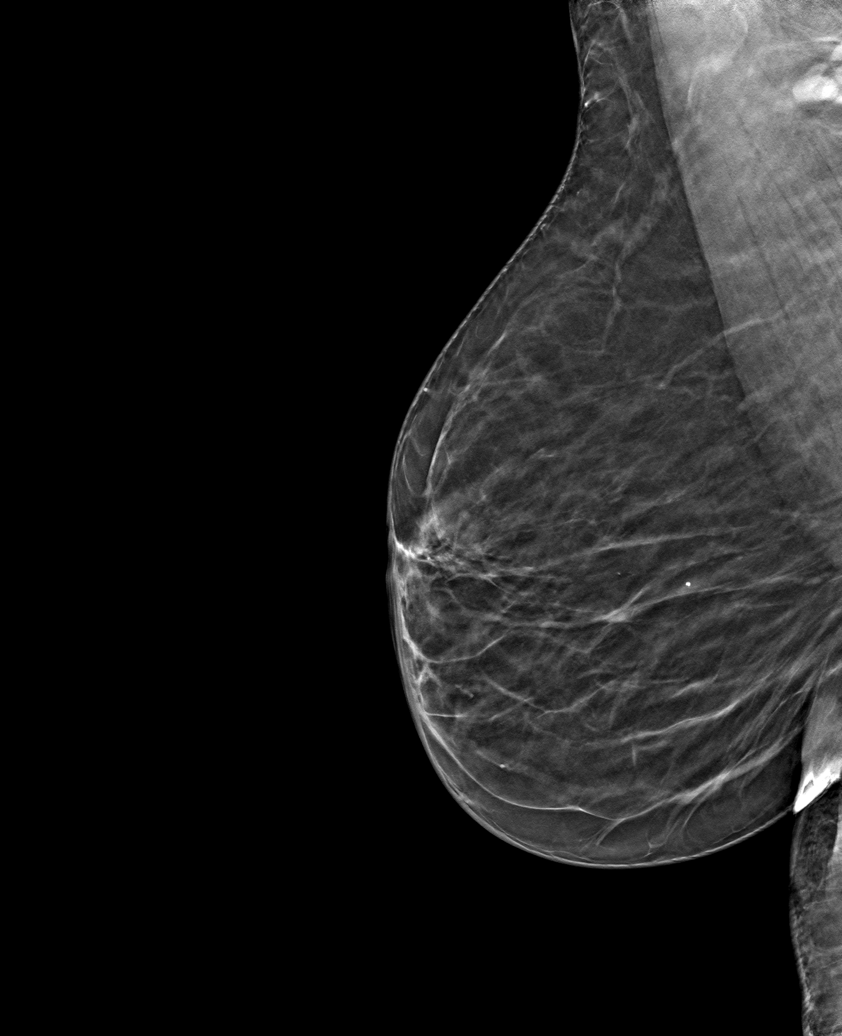

[6 of 18 positions shown; findings below may reference images not displayed]

ACR Breast Density Category b: There are scattered areas of
fibroglandular density.
FINDINGS: The patient has had a left mastectomy. There are no findings
suspicious for malignancy.
IMPRESSION: No mammographic evidence of malignancy. A result letter of this
screening mammogram will be mailed directly to the patient.

RECOMMENDATION:
Screening mammogram in one year.  (Code:NT-E-EGT)

BI-RADS CATEGORY  1: Negative.

## 2021-10-21 ENCOUNTER — Encounter: Payer: Self-pay | Admitting: Surgery

## 2021-10-21 ENCOUNTER — Other Ambulatory Visit: Payer: Self-pay

## 2021-10-21 ENCOUNTER — Telehealth: Payer: Self-pay

## 2021-10-21 ENCOUNTER — Ambulatory Visit: Payer: BC Managed Care – PPO | Admitting: Surgery

## 2021-10-21 VITALS — BP 136/84 | HR 76 | Temp 98.1°F | Ht 64.0 in | Wt 171.0 lb

## 2021-10-21 DIAGNOSIS — D1722 Benign lipomatous neoplasm of skin and subcutaneous tissue of left arm: Secondary | ICD-10-CM | POA: Insufficient documentation

## 2021-10-21 DIAGNOSIS — R2232 Localized swelling, mass and lump, left upper limb: Secondary | ICD-10-CM

## 2021-10-21 NOTE — Patient Instructions (Signed)
We will get you scheduled for an MRI of your left arm to better assess the mass. We will call you to let you know when this will be.   We will call you with these results.  Follow-up with our office as needed.  Please call and ask to speak with a nurse if you develop questions or concerns.

## 2021-10-21 NOTE — Progress Notes (Signed)
Patient ID: Janice Richmond, female   DOB: 03-08-1956, 65 y.o.   MRN: [id number]  Chief Complaint: Soft tissue mass left forearm  History of Present Illness ARIYON Richmond is a 65 y.o. female with uncertain initial identification of left forearm mass, since identified not noticed any significant increase or diminishment in size.  She has a prior history of a left mastectomy for cancer.  She denotes no pain, numbness, overlying skin changes, trauma or skin tissue discoloration.  Past Medical History Past Medical History:  Diagnosis Date   Breast cancer ([place]) 1993   left breast   Diabetes mellitus without complication ([place])    GERD (gastroesophageal reflux disease)    HLD (hyperlipidemia)    Hypertension       Past Surgical History:  Procedure Laterality Date   MASTECTOMY Left 1993   mastectomy with chemotherapy   TEE WITHOUT CARDIOVERSION N/A 02/03/2021   Procedure: TRANSESOPHAGEAL ECHOCARDIOGRAM (TEE);  Surgeon: Corey Skains, MD;  Location: [facility] ORS;  Service: Cardiovascular;  Laterality: N/A;    No Known Allergies  Current Outpatient Medications  Medication Sig Dispense Refill   amoxicillin (AMOXIL) 875 MG tablet Take 1 tablet (875 mg total) by mouth 2 (two) times daily. 20 tablet 0   aspirin EC 81 MG tablet Take 81 mg by mouth daily. Swallow whole.     atorvastatin (LIPITOR) 40 MG tablet Take 1 tablet (40 mg total) by mouth daily. 30 tablet 0   carvedilol (COREG) 25 MG tablet Take 25 mg by mouth 2 (two) times daily.     clopidogrel (PLAVIX) 75 MG tablet Take 1 tablet (75 mg total) by mouth daily. 30 tablet 0   HYDROcodone-acetaminophen (NORCO/VICODIN) 5-325 MG tablet Take 1 tablet by mouth every 6 (six) hours as needed for moderate pain. 10 tablet 0   JANUVIA 100 MG tablet Take 100 mg by mouth daily.     latanoprost (XALATAN) 0.005 % ophthalmic solution Place 1 drop into both eyes at bedtime.     losartan (COZAAR) 25 MG tablet Take 1 tablet (25 mg total) by mouth daily.  30 tablet 0   pantoprazole (PROTONIX) 40 MG tablet Take 1 tablet (40 mg total) by mouth daily. 30 tablet 0   spironolactone (ALDACTONE) 25 MG tablet Take 1 tablet (25 mg total) by mouth daily. 30 tablet 0   timolol (TIMOPTIC) 0.5 % ophthalmic solution Place 1 drop into both eyes daily.     No current facility-administered medications for this visit.    Family History Family History  Problem Relation Age of Onset   Breast cancer Other 50       did have BRCA testing and it was negative   Pancreatic cancer Other 68       two nephews   Breast cancer Sister 55       2 sisters, ages 17 and 67   Pancreatic cancer Mother 57      Social History Social History   Tobacco Use   Smoking status: Former   Smokeless tobacco: Never  Substance Use Topics   Alcohol use: Never   Drug use: Never        Review of Systems  Constitutional: Negative.   HENT: Negative.    Eyes: Negative.   Respiratory: Negative.    Cardiovascular: Negative.   Gastrointestinal: Negative.   Genitourinary: Negative.   Skin: Negative.   Neurological: Negative.   Psychiatric/Behavioral: Negative.       Physical Exam There were no vitals taken for  this visit.   CONSTITUTIONAL: Well developed, and nourished, appropriately responsive and aware without distress.   EYES: Sclera non-icteric.   EARS, NOSE, MOUTH AND THROAT: Mask worn.    Hearing is intact to voice.  NECK: Trachea appears midline, and there is no jugular venous distension.  LYMPH NODES:  Lymph nodes in the neck are not enlarged. RESPIRATORY:  Lungs are clear, and breath sounds are equal bilaterally. Normal respiratory effort without pathologic use of accessory muscles. CARDIOVASCULAR: Heart is regular in rate and rhythm. GI: The abdomen is  soft, nontender, and nondistended.  MUSCULOSKELETAL:  Symmetrical muscle tone appreciated in all four extremities.   There is a soft tissue mass on the lateral anterior aspect of the proximal forearm, fleshy  consistent with lipomatous tissue.  No overlying skin changes, fairly discrete though widespread as of 5 cm in length by roughly 3 cm in width.  Thickness is less than 1-1/2 cm. SKIN: Skin turgor is normal. No pathologic skin lesions appreciated.  NEUROLOGIC:  Motor and sensation appear grossly normal.  Cranial nerves are grossly without defect. PSYCH:  Alert and oriented to person, place and time. Affect is appropriate for situation.  Data Reviewed I have personally reviewed what is currently available of the patient's imaging, recent labs and medical records.   Labs:  CBC Latest Ref Rng & Units 04/28/2021 02/16/2021 02/03/2021  WBC 4.0 - 10.5 K/uL 7.4 6.2 7.1  Hemoglobin 12.0 - 15.0 g/dL 13.4 12.9 13.7  Hematocrit 36.0 - 46.0 % 40.1 39.1 41.8  Platelets 150 - 400 K/uL 233 256 259   CMP Latest Ref Rng & Units 04/28/2021 02/16/2021 02/03/2021  Glucose 70 - 99 mg/dL 188(H) 175(H) -  BUN 8 - 23 mg/dL 14 16 -  Creatinine 0.44 - 1.00 mg/dL 0.63 0.77 0.68  Sodium 135 - 145 mmol/L 136 136 -  Potassium 3.5 - 5.1 mmol/L 3.9 4.0 -  Chloride 98 - 111 mmol/L 106 103 -  CO2 22 - 32 mmol/L 23 23 -  Calcium 8.9 - 10.3 mg/dL 9.2 9.3 -  Total Protein 6.5 - 8.1 g/dL - - -  Total Bilirubin 0.3 - 1.2 mg/dL - - -  Alkaline Phos 38 - 126 U/L - - -  AST 15 - 41 U/L - - -  ALT 0 - 44 U/L - - -      Imaging:  Within last 24 hrs: No results found.  Assessment    Soft tissue mass left forearm, prior history left breast cancer.  Essentially asymptomatic aside from its presence. Patient Active Problem List   Diagnosis Date Noted   Right homonymous hemianopsia    Cardiomyopathy ([place])    Type 2 diabetes mellitus with hyperlipidemia ([place])    Stroke ([place]) 01/31/2021   Diabetes mellitus without complication ([facility])    Essential hypertension    HLD (hyperlipidemia)    GERD (gastroesophageal reflux disease)     Plan    As she is not interested in surgical excision as a way of diagnosing the type of tissue  present, we did discuss the option of proceeding with an MRI of the left forearm.  She would like to proceed with this for additional reassurance.   Face-to-face time spent with the patient and accompanying care providers(if present) was 25 minutes, with more than 50% of the time spent counseling, educating, and coordinating care of the patient.    These notes generated with voice recognition software. I apologize for typographical errors.  Ronny Bacon M.D., FACS  10/21/2021, 10:03 AM

## 2021-10-21 NOTE — Telephone Encounter (Signed)
Called and spoke with the patient about her MRI. The patient is scheduled for an MRI of the Left forearm at [facility] on 11/05/21. Her arrival time is at 8:30 am and she will enter in through the [place] at the hospital.

## 2021-11-05 ENCOUNTER — Ambulatory Visit: Payer: BC Managed Care – PPO

## 2022-05-23 ENCOUNTER — Other Ambulatory Visit: Payer: Self-pay | Admitting: Family Medicine

## 2022-05-23 DIAGNOSIS — Z1231 Encounter for screening mammogram for malignant neoplasm of breast: Secondary | ICD-10-CM

## 2022-06-21 ENCOUNTER — Ambulatory Visit
Admission: RE | Admit: 2022-06-21 | Discharge: 2022-06-21 | Disposition: A | Discharge status: Home / Self Care | Payer: BC Managed Care – PPO | Source: Ambulatory Visit | Attending: Family Medicine | Admitting: Family Medicine

## 2022-06-21 DIAGNOSIS — Z1231 Encounter for screening mammogram for malignant neoplasm of breast: Secondary | ICD-10-CM | POA: Insufficient documentation

## 2023-04-18 ENCOUNTER — Encounter: Payer: Self-pay | Admitting: Internal Medicine

## 2023-04-19 ENCOUNTER — Encounter
Admission: RE | Disposition: A | Discharge status: Home / Self Care | Payer: Self-pay | Source: Home / Self Care | Attending: Internal Medicine

## 2023-04-19 ENCOUNTER — Ambulatory Visit: Payer: Medicare PPO | Admitting: Anesthesiology

## 2023-04-19 ENCOUNTER — Ambulatory Visit
Admission: RE | Admit: 2023-04-19 | Discharge: 2023-04-19 | Disposition: A | Discharge status: Home / Self Care | Payer: Medicare PPO | Attending: Internal Medicine | Admitting: Internal Medicine

## 2023-04-19 DIAGNOSIS — I5022 Chronic systolic (congestive) heart failure: Secondary | ICD-10-CM | POA: Diagnosis not present

## 2023-04-19 DIAGNOSIS — K219 Gastro-esophageal reflux disease without esophagitis: Secondary | ICD-10-CM | POA: Diagnosis not present

## 2023-04-19 DIAGNOSIS — Z853 Personal history of malignant neoplasm of breast: Secondary | ICD-10-CM | POA: Insufficient documentation

## 2023-04-19 DIAGNOSIS — I447 Left bundle-branch block, unspecified: Secondary | ICD-10-CM | POA: Insufficient documentation

## 2023-04-19 DIAGNOSIS — E119 Type 2 diabetes mellitus without complications: Secondary | ICD-10-CM | POA: Insufficient documentation

## 2023-04-19 DIAGNOSIS — I509 Heart failure, unspecified: Secondary | ICD-10-CM | POA: Diagnosis not present

## 2023-04-19 DIAGNOSIS — K640 First degree hemorrhoids: Secondary | ICD-10-CM | POA: Diagnosis not present

## 2023-04-19 DIAGNOSIS — Z1211 Encounter for screening for malignant neoplasm of colon: Secondary | ICD-10-CM | POA: Diagnosis not present

## 2023-04-19 DIAGNOSIS — E785 Hyperlipidemia, unspecified: Secondary | ICD-10-CM | POA: Diagnosis not present

## 2023-04-19 DIAGNOSIS — I110 Hypertensive heart disease with heart failure: Secondary | ICD-10-CM | POA: Insufficient documentation

## 2023-04-19 DIAGNOSIS — Z87891 Personal history of nicotine dependence: Secondary | ICD-10-CM | POA: Insufficient documentation

## 2023-04-19 DIAGNOSIS — K635 Polyp of colon: Secondary | ICD-10-CM | POA: Diagnosis not present

## 2023-04-19 HISTORY — PX: COLONOSCOPY: SHX5424

## 2023-04-19 LAB — GLUCOSE, CAPILLARY: Glucose-Capillary: 129 mg/dL — ABNORMAL HIGH (ref 70–99)

## 2023-04-19 SURGERY — COLONOSCOPY
Anesthesia: General

## 2023-04-19 MED ORDER — LIDOCAINE HCL (PF) 2 % IJ SOLN
INTRAMUSCULAR | Status: DC | PRN
  Administered 2023-04-19: 40 mg via INTRADERMAL

## 2023-04-19 MED ORDER — PROPOFOL 10 MG/ML IV BOLUS
INTRAVENOUS | Status: DC | PRN
  Administered 2023-04-19: 20 mg via INTRAVENOUS
  Administered 2023-04-19 (×2): 30 mg via INTRAVENOUS
  Administered 2023-04-19 (×2): 20 mg via INTRAVENOUS
  Administered 2023-04-19 (×3): 30 mg via INTRAVENOUS
  Administered 2023-04-19: 20 mg via INTRAVENOUS
  Administered 2023-04-19: 50 mg via INTRAVENOUS

## 2023-04-19 MED ORDER — SODIUM CHLORIDE 0.9 % IV SOLN
INTRAVENOUS | Status: DC
  Administered 2023-04-19: 20 mL/h via INTRAVENOUS

## 2023-04-19 NOTE — Interval H&P Note (Signed)
History and Physical Interval Note:  04/19/2023 11:43 AM  Janice Richmond  has presented today for surgery, with the diagnosis of V76.51 (ICD-9-CM) - Z12.11 (ICD-10-CM) - Colon cancer screening.  The various methods of treatment have been discussed with the patient and family. After consideration of risks, benefits and other options for treatment, the patient has consented to  Procedure(s): COLONOSCOPY (N/A) as a surgical intervention.  The patient's history has been reviewed, patient examined, no change in status, stable for surgery.  I have reviewed the patient's chart and labs.  Questions were answered to the patient's satisfaction.     [place], [place]

## 2023-04-19 NOTE — Anesthesia Postprocedure Evaluation (Signed)
Anesthesia Post Note  Patient: Janice Richmond  Procedure(s) Performed: COLONOSCOPY  Patient location during evaluation: PACU Anesthesia Type: General Level of consciousness: awake and alert, oriented and patient cooperative Pain management: pain level controlled Vital Signs Assessment: post-procedure vital signs reviewed and stable Respiratory status: spontaneous breathing, nonlabored ventilation and respiratory function stable Cardiovascular status: blood pressure returned to baseline and stable Postop Assessment: adequate PO intake Anesthetic complications: no   No notable events documented.   Last Vitals:  Vitals:   04/19/23 1037 04/19/23 1216  BP: (!) 141/77 (!) 94/48  Pulse: 88   Resp: 20   Temp: (!) 36.1 C 36.8 C  SpO2: 100%     Last Pain:  Vitals:   04/19/23 1236  TempSrc:   PainSc: 0-No pain                 Reed Breech

## 2023-04-19 NOTE — Op Note (Signed)
[facility] Patient Name: Janice Richmond Procedure Date: 04/19/2023 11:48 AM MRN: [id number] Account #: [id number] Date of Birth: Sep 10, 1956 Admit Type: Outpatient Age: 67 Room: [facility] ENDO ROOM 2 Gender: Female Note Status: Finalized Instrument Name: Nelda Marseille [id number] Procedure:             Colonoscopy Indications:           Screening for colorectal malignant neoplasm Providers:             Royce Macadamia K. Jivan Symanski MD, MD Medicines:             Propofol per Anesthesia Complications:         No immediate complications. Procedure:             Pre-Anesthesia Assessment:                        - The risks and benefits of the procedure and the                         sedation options and risks were discussed with the                         patient. All questions were answered and informed                         consent was obtained.                        - Patient identification and proposed procedure were                         verified prior to the procedure by the nurse. The                         procedure was verified in the procedure room.                        - ASA Grade Assessment: III - A patient with severe                         systemic disease.                        - After reviewing the risks and benefits, the patient                         was deemed in satisfactory condition to undergo the                         procedure.                        After obtaining informed consent, the colonoscope was                         passed under direct vision. Throughout the procedure,                         the patient's blood pressure, pulse, and oxygen  saturations were monitored continuously. The                         Colonoscope was introduced through the anus and                         advanced to the the cecum, identified by appendiceal                         orifice and ileocecal valve. The colonoscopy was                          technically difficult and complex due to restricted                         mobility of the colon, a redundant colon and                         significant looping. Successful completion of the                         procedure was aided by applying abdominal pressure.                         The patient tolerated the procedure well. The quality                         of the bowel preparation was adequate. The ileocecal                         valve, appendiceal orifice, and rectum were                         photographed. Findings:      The perianal and digital rectal examinations were normal. Pertinent       negatives include normal sphincter tone and no palpable rectal lesions.      Non-bleeding internal hemorrhoids were found during retroflexion. The       hemorrhoids were Grade I (internal hemorrhoids that do not prolapse).      An 8 mm polyp was found in the sigmoid colon. The polyp was sessile. The       polyp was removed with a cold snare. Resection and retrieval were       complete.      The exam was otherwise without abnormality. Impression:            - Non-bleeding internal hemorrhoids.                        - One 8 mm polyp in the sigmoid colon, removed with a                         cold snare. Resected and retrieved.                        - The examination was otherwise normal. Recommendation:        - Patient has a contact number available for  emergencies. The signs and symptoms of potential                         delayed complications were discussed with the patient.                         Return to normal activities tomorrow. Written                         discharge instructions were provided to the patient.                        - Resume previous diet.                        - Continue present medications.                        - Repeat colonoscopy is recommended for surveillance.                         The  colonoscopy date will be determined after                         pathology results from today's exam become available                         for review.                        - Return to GI office PRN.                        - The findings and recommendations were discussed with                         the patient. Procedure Code(s):     --- Professional ---                        [id number], Colonoscopy, flexible; with removal of                         tumor(s), polyp(s), or other lesion(s) by snare                         technique Diagnosis Code(s):     --- Professional ---                        K64.0, First degree hemorrhoids                        D12.5, Benign neoplasm of sigmoid colon                        Z12.11, Encounter for screening for malignant neoplasm                         of colon CPT copyright 2022 American Medical Association. All rights reserved. The codes documented in this report are preliminary and upon coder review may  be revised to meet  current compliance requirements. Stanton Kidney MD, MD 04/19/2023 12:14:28 PM This report has been signed electronically. Number of Addenda: 0 Note Initiated On: 04/19/2023 11:48 AM Scope Withdrawal Time: 0 hours 6 minutes 41 seconds  Total Procedure Duration: 0 hours 16 minutes 48 seconds  Estimated Blood Loss:  Estimated blood loss: none.      [facility]

## 2023-04-19 NOTE — Transfer of Care (Signed)
Immediate Anesthesia Transfer of Care Note  Patient: Janice Richmond  Procedure(s) Performed: COLONOSCOPY  Patient Location: PACU and Endoscopy Unit  Anesthesia Type:MAC  Level of Consciousness: drowsy  Airway & Oxygen Therapy: Patient Spontanous Breathing and Patient connected to nasal cannula oxygen  Post-op Assessment: Report given to RN and Post -op Vital signs reviewed and stable  Post vital signs: Reviewed and stable  Last Vitals:  Vitals Value Taken Time  BP 94/48 04/19/23 1216  Temp    Pulse 75 04/19/23 1216  Resp 16 04/19/23 1216  SpO2 94 % 04/19/23 1216  Vitals shown include unvalidated device data.  Last Pain:  Vitals:   04/19/23 1037  TempSrc: Temporal  PainSc: 0-No pain         Complications: No notable events documented.

## 2023-04-19 NOTE — Anesthesia Preprocedure Evaluation (Addendum)
Anesthesia Evaluation  Patient identified by MRN, date of birth, ID band Patient awake    Reviewed: Allergy & Precautions, NPO status , Patient's Chart, lab work & pertinent test results  History of Anesthesia Complications Negative for: history of anesthetic complications  Airway Mallampati: III   Neck ROM: Full    Dental  (+) Missing   Pulmonary former smoker (quit in 20s)   Pulmonary exam normal breath sounds clear to auscultation       Cardiovascular hypertension, +CHF (dilated CM, EF 25-30%)  Normal cardiovascular exam Rhythm:Regular Rate:Normal  Echo 10/22/21:  MODERATE LV SYSTOLIC DYSFUNCTION   WITH MILD LVH  NORMAL RIGHT VENTRICULAR SYSTOLIC FUNCTION  MODERATE VALVULAR REGURGITATION  NO VALVULAR STENOSIS  LVEF 30%  GLS: -7.9%  Aortic: TRIVIAL AI  AOV: MILDLY THICKENED, FULLY MOBILE LEAFLETS; SCLEROSIS WITH NO EVIDENCE OF STENOSIS  Mitral: MILD - MODERATE MR (MAX VELOCITY 3.37m/s)  Tricuspid: MILD TR (3.37m/s)  Pulmonic: MILD PI  MODERATE LAE  MILD LVE   Myocardial perfusion 03/15/21:  Abnormal Lexiscan infusion EKG due to baseline EKG changes and left bundle  branch block  Moderate global LV systolic dysfunction with ejection fraction of 28%  Myocardial perfusion without evidence of myocardial ischemia     Neuro/Psych CVA (01/2021, no residual deficits)    GI/Hepatic ,GERD  ,,  Endo/Other  diabetes, Type 2    Renal/GU negative Renal ROS     Musculoskeletal   Abdominal   Peds  Hematology Breast CA   Anesthesia Other Findings Cardiology note 12/15/22:  Plan   1. Dilated cardiomyopathy/chronic systolic CHF: Currently symptomatically stable with no complaints of shortness of breath or lower extremity edema. Appears euvolemic in the office today -Continue spironolactone 25 mg daily -Continue valsartan 320 mg daily -Continue carvedilol 25 mg twice daily  2. Hypertension: Stable today at  124/80 -Continue amlodipine, carvedilol, spironolactone, valsartan  3. Hyperlipidemia: On atorvastatin 40 mg daily with no reported side effects to management at this time -Continue atorvastatin with no changes  4. Left bundle branch block: Chronic in nature with most recent testing including Lexiscan showing no evidence of myocardial ischemia. -No changes to current management  No orders of the defined types were placed in this encounter.  Return in about 9 months (around 09/15/2023).    Reproductive/Obstetrics                              Anesthesia Physical Anesthesia Plan  ASA: 3  Anesthesia Plan: General   Post-op Pain Management:    Induction: Intravenous  PONV Risk Score and Plan: 3 and Propofol infusion, TIVA and Treatment may vary due to age or medical condition  Airway Management Planned: Natural Airway  Additional Equipment:   Intra-op Plan:   Post-operative Plan:   Informed Consent: I have reviewed the patients History and Physical, chart, labs and discussed the procedure including the risks, benefits and alternatives for the proposed anesthesia with the patient or authorized representative who has indicated his/her understanding and acceptance.       Plan Discussed with: CRNA  Anesthesia Plan Comments: (LMA/GETA backup discussed.  Patient consented for risks of anesthesia including but not limited to:  - adverse reactions to medications - damage to eyes, teeth, lips or other oral mucosa - nerve damage due to positioning  - sore throat or hoarseness - damage to heart, brain, nerves, lungs, other parts of body or loss of life  Informed patient about role  of CRNA in peri- and intra-operative care.  Patient voiced understanding.)         Anesthesia Quick Evaluation

## 2023-04-19 NOTE — H&P (Signed)
Outpatient short stay form Pre-procedure 04/19/2023 11:43 AM Janice Richmond K. Norma Fredrickson, M.D.  Primary Physician: Darreld Mclean, M.D.  Reason for visit:  Colon cancer screening  History of present illness:  Ms. Delatte presents to the [facility] GI clinic at the request of her PCP - Dr. Darreld Mclean - at the [facility] for chief complaint of routine-risk colon cancer screening. She is due for routine colonoscopy. Last colonoscopy 07/2012 showed one diminutive hyperplastic polyp removed from descending colon. She has no known personal hx of adenomatous colon polyps. There is no known family history of colorectal adenocarcinoma. She denies any acute GI complaints or concerns at this time. She denies any recent changes in her bowel habits. She has a formed BM daily without any issues with fecal urgency, fecal incontinence, hematochezia, melena, or abdominal pain. Appetite and diet are stable. She has had mild weight gain since the holidays due to increased caloric intake. She has no unintentional weight loss. She denies any UGI symptoms such as esophageal dysphagia, odynophagia, early satiety, nausea, vomiting, hoarseness, or epigastric abdominal pain. She does have hx of CVA in 01/2021. No chronic antiplatelet therapy other than baby aspirin. She follows here in Cardiology for chronic systolic CHF and LBBB. No recent cardiac events.     Current Facility-Administered Medications:    0.9 %  sodium chloride infusion, , Intravenous, Continuous, [place], Boykin Nearing, MD, Last Rate: 20 mL/hr at 04/19/23 1137, Continued from Pre-op at 04/19/23 1137  Medications Prior to Admission  Medication Sig Dispense Refill Last Dose   amLODipine (NORVASC) 5 MG tablet Take 5 mg by mouth daily.   04/19/2023 at 0600   aspirin 81 MG EC tablet Take 1 tablet by mouth daily.   04/19/2023 at 0600   atorvastatin (LIPITOR) 40 MG tablet Take 1 tablet (40 mg total) by mouth daily. 30 tablet 0 04/18/2023   carvedilol (COREG) 25 MG  tablet Take 25 mg by mouth 2 (two) times daily.   04/19/2023 at 0600   fluticasone (FLONASE) 50 MCG/ACT nasal spray Place into the nose.   04/18/2023   latanoprost (XALATAN) 0.005 % ophthalmic solution Place 1 drop into both eyes at bedtime.   04/19/2023 at 0600   linagliptin (TRADJENTA) 5 MG TABS tablet Take by mouth.   04/18/2023   Loratadine 10 MG CAPS Take by mouth.   04/18/2023   losartan (COZAAR) 25 MG tablet Take 1 tablet (25 mg total) by mouth daily. 30 tablet 0 04/19/2023 at 0600   Magnesium Oxide 400 MG CAPS Take 1 capsule by mouth 2 (two) times daily.   Past Week   metFORMIN (GLUCOPHAGE) 500 MG tablet Take 500 mg by mouth 2 (two) times daily.   Past Week   pantoprazole (PROTONIX) 40 MG tablet Take 1 tablet (40 mg total) by mouth daily. 30 tablet 0 Past Week   timolol (TIMOPTIC) 0.5 % ophthalmic solution Place 1 drop into both eyes daily.   Past Week   valsartan (DIOVAN) 160 MG tablet Take 2 tablets by mouth daily.   04/18/2023   clopidogrel (PLAVIX) 75 MG tablet Take 1 tablet (75 mg total) by mouth daily. (Patient not taking: Reported on 04/19/2023) 30 tablet 0 Completed Course   PATADAY 0.2 % SOLN Apply to eye.      spironolactone (ALDACTONE) 25 MG tablet Take 1 tablet (25 mg total) by mouth daily. 30 tablet 0      No Known Allergies   Past Medical History:  Diagnosis Date   Breast cancer (  HCC) 1993   left breast   Diabetes mellitus without complication (HCC)    GERD (gastroesophageal reflux disease)    HLD (hyperlipidemia)    Hypertension     Review of systems:  Otherwise negative.    Physical Exam  Gen: Alert, oriented. Appears stated age.  HEENT: [place]/AT. PERRLA. Lungs: CTA, no wheezes. CV: RR nl S1, S2. Abd: soft, benign, no masses. BS+ Ext: No edema. Pulses 2+    Planned procedures: Proceed with colonoscopy. The patient understands the nature of the planned procedure, indications, risks, alternatives and potential complications including but not limited to bleeding,  infection, perforation, damage to internal organs and possible oversedation/side effects from anesthesia. The patient agrees and gives consent to proceed.  Please refer to procedure notes for findings, recommendations and patient disposition/instructions.     Cassity Christian K. Norma Fredrickson, M.D. Gastroenterology 04/19/2023  11:43 AM

## 2023-04-20 ENCOUNTER — Encounter: Payer: Self-pay | Admitting: Internal Medicine

## 2023-05-15 ENCOUNTER — Other Ambulatory Visit: Payer: Self-pay | Admitting: Family Medicine

## 2023-05-15 DIAGNOSIS — Z1231 Encounter for screening mammogram for malignant neoplasm of breast: Secondary | ICD-10-CM

## 2023-06-26 ENCOUNTER — Ambulatory Visit
Admission: RE | Admit: 2023-06-26 | Discharge: 2023-06-26 | Disposition: A | Discharge status: Home / Self Care | Payer: Medicare PPO | Source: Ambulatory Visit | Attending: Family Medicine | Admitting: Family Medicine

## 2023-06-26 DIAGNOSIS — Z1231 Encounter for screening mammogram for malignant neoplasm of breast: Secondary | ICD-10-CM | POA: Insufficient documentation

## 2023-08-08 ENCOUNTER — Inpatient Hospital Stay: Payer: Medicare PPO | Attending: Oncology

## 2023-08-08 ENCOUNTER — Encounter: Payer: Self-pay | Admitting: Licensed Clinical Social Worker

## 2023-08-08 ENCOUNTER — Inpatient Hospital Stay: Payer: Medicare PPO | Admitting: Licensed Clinical Social Worker

## 2023-08-08 DIAGNOSIS — Z803 Family history of malignant neoplasm of breast: Secondary | ICD-10-CM | POA: Insufficient documentation

## 2023-08-08 DIAGNOSIS — Z9012 Acquired absence of left breast and nipple: Secondary | ICD-10-CM | POA: Insufficient documentation

## 2023-08-08 DIAGNOSIS — Z800 Family history of malignant neoplasm of digestive organs: Secondary | ICD-10-CM

## 2023-08-08 DIAGNOSIS — Z87891 Personal history of nicotine dependence: Secondary | ICD-10-CM | POA: Insufficient documentation

## 2023-08-08 DIAGNOSIS — Z853 Personal history of malignant neoplasm of breast: Secondary | ICD-10-CM

## 2023-08-08 DIAGNOSIS — Z801 Family history of malignant neoplasm of trachea, bronchus and lung: Secondary | ICD-10-CM | POA: Insufficient documentation

## 2023-08-08 DIAGNOSIS — Z1501 Genetic susceptibility to malignant neoplasm of breast: Secondary | ICD-10-CM | POA: Insufficient documentation

## 2023-08-08 DIAGNOSIS — Z1509 Genetic susceptibility to other malignant neoplasm: Secondary | ICD-10-CM | POA: Insufficient documentation

## 2023-08-08 DIAGNOSIS — Z8481 Family history of carrier of genetic disease: Secondary | ICD-10-CM

## 2023-08-08 NOTE — Progress Notes (Signed)
REFERRING PROVIDER: Self-referred  PRIMARY PROVIDER:  Leanna Sato, MD  PRIMARY REASON FOR VISIT:  1. Personal history of breast cancer   2. Family history of breast cancer   3. Family history of BRCA1 gene positive   4. Family history of pancreatic cancer   5. Family history of lung cancer      HISTORY OF PRESENT ILLNESS:   Janice Richmond, a 67 y.o. female, was seen for a  cancer genetics consultation due to her daughter's recent genetic testing that showed a BRCA1 mutation.  Janice Richmond presents to clinic today to discuss the possibility of a hereditary predisposition to cancer, genetic testing, and to further clarify her future cancer risks, as well as potential cancer risks for family members.   CANCER HISTORY:  At the age of 61, Janice Richmond was diagnosed with breast cancer. This was treated with mastectomy. She did not have genetic testing at the time.   RISK FACTORS:  Ovaries intact: yes.  Hysterectomy: no.  Menopausal status: postmenopausal. Colonoscopy: yes Mammogram within the last year: yes.  Past Medical History:  Diagnosis Date   Breast cancer [facility]) 1993   left breast   Diabetes mellitus without complication (HCC)    GERD (gastroesophageal reflux disease)    HLD (hyperlipidemia)    Hypertension     Past Surgical History:  Procedure Laterality Date   COLONOSCOPY N/A 04/19/2023   Procedure: COLONOSCOPY;  Surgeon: [place], Boykin Nearing, MD;  Location: [facility] ENDOSCOPY;  Service: Gastroenterology;  Laterality: N/A;   MASTECTOMY Left 1993   mastectomy with chemotherapy   TEE WITHOUT CARDIOVERSION N/A 02/03/2021   Procedure: TRANSESOPHAGEAL ECHOCARDIOGRAM (TEE);  Surgeon: Lamar Blinks, MD;  Location: [facility] ORS;  Service: Cardiovascular;  Laterality: N/A;    FAMILY HISTORY:  We obtained a detailed, 4-generation family history.  Significant diagnoses are listed below: Family History  Problem Relation Age of Onset   Pancreatic cancer Mother 74   Lung cancer  Father    Breast cancer Sister 3       2 sisters, ages 28 and 38   Brain cancer Brother    Lung cancer Brother    Stomach cancer Maternal Grandmother    Breast cancer Other 40       did have BRCA testing and it was negative   Breast cancer Other 59       BRCA1+   Breast cancer Daughter 31       BRCA1+   Pancreatic cancer Cousin    Janice Richmond has 1 daughter, Janice Richmond, and 1 son. 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   Janice Richmond had 3 brothers and 5 sisters. One sister had breast cancer at 34, lung cancer at 10. Another sister had breast cancer at 72 and passed at 60. Two brothers died of lung cancer. Another brother's daughter had breast cancer in her 37s, and her daughter had breast cancer at 37 and is reportedly BRCA1 positive.   Janice Richmond mother died of pancreatic cancer in her 95s. Her nephew, patient's cousin, also had pancreatic cancer. Maternal grandmother died of stomach cancer.   Janice Richmond father died of lung cancer at 70.   Janice Richmond is aware of previous family history of genetic testing for hereditary cancer risks. There is no reported Ashkenazi Jewish ancestry. There is no known consanguinity.    GENETIC COUNSELING ASSESSMENT: Janice Richmond is a 67 y.o. female with  a personal history of breast cancer and family history of BRCA1 mutation. We, therefore, discussed and recommended the following at today's visit.   DISCUSSION: We discussed that approximately 10% of cancer is hereditary. We discussed that she has a 50% chance to have the same BRCA1 mutation her daughter has, although it is likely given her persona/family history of breast cancer that she does have this BRCA1 mutation. We discussed that testing is beneficial for several reasons including knowing about cancer risks, identifying potential screening and risk-reduction options that may be appropriate, and to  understand if other family members could be at risk for cancer and allow them to undergo genetic testing.   We reviewed the characteristics, features and inheritance patterns of hereditary cancer syndromes. We also discussed genetic testing, including the appropriate family members to test, the process of testing, insurance coverage and turn-around-time for results. We discussed the implications of a negative, positive and/or variant of uncertain significant result. We recommended Janice Richmond pursue genetic testing for the family variant in BRCA1.  Based on Janice Richmond personal and family history of cancer, she meets medical criteria for genetic testing. She will not have a cost as we will order testing through [facility]'s family variant program.  PLAN: After considering the risks, benefits, and limitations, Janice Richmond provided informed consent to pursue genetic testing and the blood sample was sent to [facility] for analysis of the BRCA1 family variant. Results should be available within approximately 2-3 weeks' time, at which point they will be disclosed by telephone to Janice Richmond, as will any additional recommendations warranted by these results. Janice Richmond will receive a summary of her genetic counseling visit and a copy of her results once available. This information will also be available in Epic.   Janice Richmond's questions were answered to her satisfaction today. Our contact information was provided should additional questions or concerns arise. Thank you for the referral and allowing [place] to share in the care of your patient.   Lacy Duverney, MS, [facility] Genetic Counselor [email or web address] Phone: [phone]  The patient was seen for a total of 20 minutes in face-to-face genetic counseling. Patient's daughter Janice Richmond was present. Dr. Blake Divine was available for discussion regarding this case.   _______________________________________________________________________ For  Office Staff:  Number of people involved in session: 2 Was an Intern/ student involved with case: no

## 2023-08-14 ENCOUNTER — Telehealth: Payer: Self-pay | Admitting: Licensed Clinical Social Worker

## 2023-08-22 ENCOUNTER — Encounter: Payer: Self-pay | Admitting: Licensed Clinical Social Worker

## 2023-08-22 ENCOUNTER — Ambulatory Visit: Payer: Self-pay | Admitting: Licensed Clinical Social Worker

## 2023-08-22 DIAGNOSIS — Z1379 Encounter for other screening for genetic and chromosomal anomalies: Secondary | ICD-10-CM

## 2023-08-22 DIAGNOSIS — Z1501 Genetic susceptibility to malignant neoplasm of breast: Secondary | ICD-10-CM | POA: Insufficient documentation

## 2023-08-22 DIAGNOSIS — Z1589 Genetic susceptibility to other disease: Secondary | ICD-10-CM | POA: Insufficient documentation

## 2023-08-22 NOTE — Telephone Encounter (Signed)
I contacted Ms. Janice Richmond to discuss her genetic testing results. 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  Detailed clinic note to follow.   The test report has been scanned into EPIC and is located under the Molecular Pathology section of the Results Review tab.  A portion of the result report is included below for reference.      Lacy Duverney, MS, [facility] Genetic Counselor [email or web address] Phone: [phone]

## 2023-08-22 NOTE — Progress Notes (Signed)
Genetic Test Results - BRCA1+  HPI:   Janice Richmond was previously seen in the [facility] due to a personal and family history of cancer and concerns regarding a hereditary predisposition to cancer. Please refer to our prior cancer genetics clinic note for more information regarding our discussion, assessment and recommendations, at the time. Janice Richmond recent genetic test results were disclosed to her, as were recommendations warranted by these results. These results and recommendations are discussed in more detail below.  CANCER HISTORY:  Oncology History   No history exists.    FAMILY HISTORY:  We obtained a detailed, 4-generation family history.  Significant diagnoses are listed below: Family History  Problem Relation Age of Onset   Pancreatic cancer Mother 103   Lung cancer Father    Breast cancer Sister 59       2 sisters, ages 64 and 50   Brain cancer Brother    Lung cancer Brother    Stomach cancer Maternal Grandmother    Breast cancer Other 40       did have BRCA testing and it was negative   Breast cancer Other 29       BRCA1+   Breast cancer Daughter 51       BRCA1+   Pancreatic cancer Cousin     Janice Richmond has 1 daughter, Janice Richmond, and 1 son. 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    Janice Richmond had 3 brothers and 5 sisters. One sister had breast cancer at 41, lung cancer at 37. Another sister had breast cancer at 76 and passed at 34. Two brothers died of lung cancer. Another brother's daughter had breast cancer in her 13s, and her daughter had breast cancer at 45 and is reportedly BRCA1 positive.    Janice Richmond mother died of pancreatic cancer in her 56s. Her nephew, patient's cousin, also had pancreatic cancer. Maternal grandmother died of stomach cancer.    Janice Richmond father died of lung cancer at 71.    Janice Richmond is aware of previous family  history of genetic testing for hereditary cancer risks. There is no reported Ashkenazi Jewish ancestry. There is no known consanguinity.     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   The test report has been scanned into EPIC and is located under the Molecular Pathology section of the Results Review tab.  A portion of the result report is included below for reference. Genetic testing reported out on 08/14/2023.      Clinical Information: Hereditary breast and ovarian cancer (HBOC) syndrome is characterized by an increased lifetime risk for, generally, adult-onset cancers including, breast, contralateral breast, female breast, ovarian, prostate, melanoma and pancreatic.   The cancers associated with BRCA1 are:   Female breast cancer, up to a 78% risk Up to 73% of the breast cancers diagnosed in women with BRCA1 mutations are triple negative breast cancer In women with a history of breast cancer, the cumulative risk for contralateral breast cancer 20 years after breast cancer diagnosis is 30-40%. Female breast cancer, up to a 2% risk Ovarian cancer, 39-58% risk Pancreatic cancer, 3-5% risk Prostate cancer, 7-26% risk Limited data suggest there may be a slightly increased risk of serous uterine cancer   Management Recommendations:   Breast Screening/Risk Reduction:   Women: Breast awareness starting at age 15 Clinical  breast examination every 6-12 months starting at age 69  Breast cancer screening: Age 32-29 years, annual breast MRI with and without contrast (or mammogram, if MRI is unavailable), although the age to initiate screening may be individualized based on family history Age 57-75 years, annual mammogram and breast MRI with and without contrast Age >75 years, management should be considered on an individual basis For women with a BRCA1 pathogenic or likely pathogenic variant who are treated  for breast cancer and have not had a bilateral mastectomy, screening with annual mammogram and breast MRI should continue as described above. The option of prophylactic bilateral risk-reducing mastectomy (RRM), removal of the breast tissue before cancer develops, is the best option for significantly decreasing the risk of developing breast cancer. Studies have shown mastectomies reduce the risk of breast cancer by 90-95% in women with a BRCA1 mutation.    Males: Breast self-exam training and education starting at age 59 years Annual clinical breast exam starting at age 24 years  Consider annual mammogram starting at age 66 or 10 years before the earliest known female breast cancer in the family (whichever comes first).   Gynecological Cancer Screening/Risk Reduction: It is recommended that women with a BRCA1 mutation have a risk-reducing salpingo oophorectomy (RRSO), removal of the ovaries and fallopian tubes, between the ages of 60-40 or once childbearing is completed. Having a RRSO is estimated to reduce the risk of ovarian cancer by up to 96%. There is still a small risk of developing an "ovarian-like" cancer in the lining of the abdomen, called the peritoneum. Another benefit to having the ovaries removed is the risk reduction for breast cancer. If the ovaries are removed before menopause, the risk of developing breast cancer is reduced. Women undergoing a RRSO should be aware of the potential risks and benefits of concurrent hysterectomy. Hormone replacement therapy could be considered based on the physician's discretion. Ovarian cancer screening is an option for women who chose not to have a RRSO or who have not yet completed their family. Current screening methods for ovarian cancer are neither sensitive nor specific, meaning that often early stage ovarian cancer cannot be diagnosed through this screening.  Screening can also be falsely positive with no cancer present. For this reason, RRSO is  recommended over screening. If ovarian cancer screening is recommended by a physician, it could include: CA-125 blood tests Transvaginal ultrasounds Clinical pelvic exams   Prostate Cancer Screening: Consider annual digital rectal exam (DRE) at age 29 Consider annual PSA blood test at age 36   Pancreatic Cancer Screening/Risk Reduction: Consider pancreatic screening at age 87 (or 39 years younger than the earliest exocrine pancreatic cancer diagnosis in the family, whichever is earlier) NCCN recommends screening be performed in experienced, high-volume centers with discussion about potential limitations Consider screening using annual contrast-enhanced MRI MRCP and/or EUS with consideration of shorter screening intervals based on clinical judgement    Additional Considerations: Individuals at risk for developing breast and ovarian cancer may benefit from the use of medication to reduce their risk for cancer. These medications are referred to as chemoprevention. For example, oral contraceptive use has been shown to reduce the risk of ovarian cancer by approximately 60% in BRCA1 mutation carriers if taken for at least 5 years. This risk reduction remains even after discontinuation of oral contraceptives. Recent studies have suggested PARP inhibitors may be a beneficial chemotherapeutic Richmond for a subset of patients with BRCA1-associated breast, ovarian, prostate, and pancreatic cancers. Clinical trials are currently in process  to determine if and how these agents can be useful in the treatment of BRCA1 cancer patients. Patients of reproductive age should be made aware of options for prenatal diagnosis and assisted reproduction including pre-implantation genetic diagnosis. Individuals with a single pathogenic BRCA1 variant are carriers of Fanconi anemia. Fanconi anemia is characterized by developmental delay apparent from infancy, short stature, microcephaly, and coarse dysmorphic features. For there  to be a risk of Fanconi anemia in offspring, both the patient and their partner would each have to carry a pathogenic variant in BRCA1. In this case, the risk of having an affected child is 25%.    Guidelines are from NCCN v.1.2025 Genetic/Familial High Risk Assessment: Breast, Ovarian and Pancreatic. This is based on our current understanding of BRCA1 and may change in the future.     Implications for Family Members: Hereditary predisposition to cancer due to pathogenic variants in the BRCA1 gene has autosomal dominant inheritance. This means that an individual with a pathogenic variant has a 50% chance of passing the condition on to his/her offspring. Identification of a pathogenic variant allows for the recognition of at-risk relatives who can pursue testing for the familial variant.   Family members are encouraged to consider genetic testing for this familial pathogenic variant. As there are generally no childhood cancer risks associated with a single pathogenic variant in the BRCA1 gene, individuals in the family are not recommended to have testing until they reach at least 65 years of age. They may contact our office at [phone] or more information or to schedule an appointment.  Complimentary testing for the familial variant is available for 150 days after the genetic testing report date.  Family members who live outside of the area are encouraged to find a genetic counselor in their area by visiting: [email or web address].   Resources: FORCE (Facing Our Risk of Cancer Empowered) is a resource for those with a hereditary predisposition to develop cancer.  FORCE provides information about risk reduction, advocacy, legislation, and clinical trials.  Additionally, FORCE provides a platform for collaboration and support; which includes: peer navigation, message boards, local support groups, a toll-free helpline, research registry and recruitment, advocate training, published  medical research, webinars, brochures, mastectomy photos, and more.  For more information, visit www.facingourrisk.org   PLAN: 1. These results will be made available to  her PCP, Darreld Mclean, MD. She will also be referred to the high risk breast clinic. Sh would like these providers to follow her long-term for this indication and coordinate screening/prophylactic surgeries.    2. Janice Richmond plans to discuss these results with her family and will reach out to [place] if we can be of any assistance in coordinating genetic testing for any of her relatives.    We encouraged Janice Richmond to remain in contact with [place] on an annual basis so we can update her personal and family histories, and let her know of advances in cancer genetics that may benefit the family. Our contact number was provided. Janice Richmond's questions were answered to her satisfaction today, and she knows she is welcome to call anytime with additional questions.    Lacy Duverney, MS, [facility] Genetic Counselor [email or web address] Phone: [phone]       Lacy Duverney, MS, [facility] Genetic Counselor [email or web address] Phone: [phone]

## 2023-08-28 ENCOUNTER — Inpatient Hospital Stay: Payer: Medicare PPO | Admitting: Oncology

## 2023-08-30 ENCOUNTER — Other Ambulatory Visit: Payer: Self-pay

## 2023-08-30 ENCOUNTER — Emergency Department
Admission: EM | Admit: 2023-08-30 | Discharge: 2023-08-30 | Disposition: A | Discharge status: Home / Self Care | Payer: Medicare PPO | Attending: Emergency Medicine | Admitting: Emergency Medicine

## 2023-08-30 DIAGNOSIS — N390 Urinary tract infection, site not specified: Secondary | ICD-10-CM | POA: Diagnosis not present

## 2023-08-30 DIAGNOSIS — R319 Hematuria, unspecified: Secondary | ICD-10-CM | POA: Diagnosis present

## 2023-08-30 LAB — URINALYSIS, ROUTINE W REFLEX MICROSCOPIC
Bacteria, UA: NONE SEEN
Bilirubin Urine: NEGATIVE
Glucose, UA: >=500 mg/dL — AB
Ketones, ur: NEGATIVE mg/dL
Nitrite: NEGATIVE
Protein, ur: NEGATIVE mg/dL
RBC / HPF: >50 RBC/hpf (ref 0–5)
Specific Gravity, Urine: 1.022 (ref 1.005–1.030)
pH: 6 (ref 5.0–8.0)

## 2023-08-30 LAB — BASIC METABOLIC PANEL
Anion gap: 8 (ref 5–15)
BUN: 21 mg/dL (ref 8–23)
CO2: 26 mmol/L (ref 22–32)
Calcium: 8.9 mg/dL (ref 8.9–10.3)
Chloride: 103 mmol/L (ref 98–111)
Creatinine, Ser: 0.86 mg/dL (ref 0.44–1.00)
GFR, Estimated: >60 mL/min (ref >60)
Glucose, Bld: 260 mg/dL — ABNORMAL HIGH (ref 70–99)
Potassium: 3.5 mmol/L (ref 3.5–5.1)
Sodium: 137 mmol/L (ref 135–145)

## 2023-08-30 LAB — CBC WITH DIFFERENTIAL/PLATELET
Abs Immature Granulocytes: 0.01 10*3/uL (ref 0.00–0.07)
Basophils Absolute: 0.1 10*3/uL (ref 0.0–0.1)
Basophils Relative: 1 %
Eosinophils Absolute: 0.2 10*3/uL (ref 0.0–0.5)
Eosinophils Relative: 3 %
HCT: 37.2 % (ref 36.0–46.0)
Hemoglobin: 11.8 g/dL — ABNORMAL LOW (ref 12.0–15.0)
Immature Granulocytes: 0 %
Lymphocytes Relative: 30 %
Lymphs Abs: 1.6 10*3/uL (ref 0.7–4.0)
MCH: 28.5 pg (ref 26.0–34.0)
MCHC: 31.7 g/dL (ref 30.0–36.0)
MCV: 89.9 fL (ref 80.0–100.0)
Monocytes Absolute: 0.5 10*3/uL (ref 0.1–1.0)
Monocytes Relative: 10 %
Neutro Abs: 3 10*3/uL (ref 1.7–7.7)
Neutrophils Relative %: 56 %
Platelets: 235 10*3/uL (ref 150–400)
RBC: 4.14 MIL/uL (ref 3.87–5.11)
RDW: 15 % (ref 11.5–15.5)
WBC: 5.3 10*3/uL (ref 4.0–10.5)
nRBC: 0 % (ref 0.0–0.2)

## 2023-08-30 MED ORDER — CEPHALEXIN 500 MG PO CAPS: 500.0000 mg | ORAL_CAPSULE | Freq: Two times a day (BID) | ORAL | 0 refills | Status: AC

## 2023-08-30 MED ORDER — CEPHALEXIN 500 MG PO CAPS
500.0000 mg | ORAL_CAPSULE | Freq: Once | ORAL | Status: AC
  Administered 2023-08-30: 500 mg via ORAL
  Filled 2023-08-30: qty 1

## 2023-08-30 NOTE — ED Triage Notes (Signed)
Pt states she had 2 episodes of hematuria earlier today, pt denies back pain states she does have some lower abd cramping. Pt denies weakness or dizziness. Pt does not take blood thinners.

## 2023-08-30 NOTE — ED Provider Notes (Signed)
[facility] Provider Note    Event Date/Time   First MD Initiated Contact with Patient 08/30/23 2104     (approximate)   History   Hematuria   HPI  Janice Richmond is a 67 y.o. female who presents with complaints of small months of hematuria today and discomfort with urination.  No back pain, no fevers, no flank pain     Physical Exam   Triage Vital Signs: ED Triage Vitals  Encounter Vitals Group     BP 08/30/23 1908 (!) 156/84     Systolic BP Percentile --      Diastolic BP Percentile --      Pulse Rate 08/30/23 1908 90     Resp 08/30/23 1908 18     Temp 08/30/23 1908 97.9 F (36.6 C)     Temp Source 08/30/23 1908 Oral     SpO2 08/30/23 1908 100 %     Weight 08/30/23 1907 92.5 kg (204 lb)     Height 08/30/23 1907 1.651 m (5\' 5" )     Head Circumference --      Peak Flow --      Pain Score 08/30/23 1907 1     Pain Loc --      Pain Education --      Exclude from Growth Chart --     Most recent vital signs: Vitals:   08/30/23 2130 08/30/23 2213  BP: (!) 141/74   Pulse: 86   Resp: 18   Temp:  97.8 F (36.6 C)  SpO2: 100%      General: Awake, no distress.  CV:  Good peripheral perfusion.  Resp:  Normal effort.  Abd:  No distention.  Other:     ED Results / Procedures / Treatments   Labs (all labs ordered are listed, but only abnormal results are displayed) Labs Reviewed  CBC WITH DIFFERENTIAL/PLATELET - Abnormal; Notable for the following components:      Result Value   Hemoglobin 11.8 (*)    All other components within normal limits  BASIC METABOLIC PANEL - Abnormal; Notable for the following components:   Glucose, Bld 260 (*)    All other components within normal limits  URINALYSIS, ROUTINE W REFLEX MICROSCOPIC - Abnormal; Notable for the following components:   Color, Urine YELLOW (*)    APPearance CLOUDY (*)    Glucose, UA >=500 (*)    Hgb urine dipstick LARGE (*)    Leukocytes,Ua MODERATE (*)    All other  components within normal limits     EKG     RADIOLOGY     PROCEDURES:  Critical Care performed:   Procedures   MEDICATIONS ORDERED IN ED: Medications  cephALEXin (KEFLEX) capsule 500 mg (500 mg Oral Given 08/30/23 2159)     IMPRESSION / MDM / ASSESSMENT AND PLAN / ED COURSE  I reviewed the triage vital signs and the nursing notes. Patient's presentation is most consistent with acute complicated illness / injury requiring diagnostic workup.  Patient's presentation suspicious for urinary tract infection, urinalysis is consistent with UTI, will treat with Keflex 3 times daily, outpatient follow-up with PCP recommended        FINAL CLINICAL IMPRESSION(S) / ED DIAGNOSES   Final diagnoses:  Lower urinary tract infectious disease     Rx / DC Orders   ED Discharge Orders          Ordered    cephALEXin (KEFLEX) 500 MG capsule  2 times daily  08/30/23 2209             Note:  This document was prepared using Dragon voice recognition software and may include unintentional dictation errors.   Jene Every, MD 08/30/23 2221

## 2023-09-04 ENCOUNTER — Inpatient Hospital Stay: Payer: Medicare PPO

## 2023-09-04 ENCOUNTER — Inpatient Hospital Stay (HOSPITAL_BASED_OUTPATIENT_CLINIC_OR_DEPARTMENT_OTHER): Payer: Medicare PPO | Admitting: Oncology

## 2023-09-04 ENCOUNTER — Encounter: Payer: Self-pay | Admitting: Oncology

## 2023-09-04 VITALS — BP 149/92 | HR 87 | Temp 96.0°F | Resp 18 | Wt 205.6 lb

## 2023-09-04 DIAGNOSIS — Z803 Family history of malignant neoplasm of breast: Secondary | ICD-10-CM | POA: Diagnosis not present

## 2023-09-04 DIAGNOSIS — Z853 Personal history of malignant neoplasm of breast: Secondary | ICD-10-CM

## 2023-09-04 DIAGNOSIS — Z800 Family history of malignant neoplasm of digestive organs: Secondary | ICD-10-CM

## 2023-09-04 DIAGNOSIS — Z801 Family history of malignant neoplasm of trachea, bronchus and lung: Secondary | ICD-10-CM

## 2023-09-04 DIAGNOSIS — Z1509 Genetic susceptibility to other malignant neoplasm: Secondary | ICD-10-CM

## 2023-09-04 DIAGNOSIS — Z9012 Acquired absence of left breast and nipple: Secondary | ICD-10-CM

## 2023-09-04 DIAGNOSIS — Z87891 Personal history of nicotine dependence: Secondary | ICD-10-CM | POA: Diagnosis not present

## 2023-09-04 DIAGNOSIS — Z1501 Genetic susceptibility to malignant neoplasm of breast: Secondary | ICD-10-CM | POA: Diagnosis not present

## 2023-09-04 DIAGNOSIS — Z139 Encounter for screening, unspecified: Secondary | ICD-10-CM

## 2023-09-04 NOTE — Assessment & Plan Note (Addendum)
Genetic testing results were reviewed and discussed with patient.  Recommend first-degree relatives to be screened Breast Cancer Screening: Breast awarenessa starting at age 65 years. Clinical breast exam, every 6-12 months Annual mammogram and breast MRIe screening with and without contrast  Discussed about option of risk reduction bilateral mastectomy-patient declines surgery due to heart issue. Limited data on tamoxifen or aromatase inhibitors (AIs) for risk reduction    Ovarian/FallopianTube / Peritoneal / Uterine Cancers : Recommend Surgical risk reduction with bilateral salpingo-oophorectomy-patient declined due to heart issue. Check CA-125 annually.  Recommend patient to see gynecology for pelvic examination and ultrasound of adnexa     Pancreatic Cancer Screening: BR?A1/2 carriers with a first- or second-degree relative from the same side of the family as the pathogenic or likely pathologic variant with exocrine pancreatic ??n??r consider pancreatic ?an??r screening beginning at age 40. -Patient has family history of pancreatic cancer.  Will obtain MRI abdomen with and without contrast annually   Melanoma and non-melanomatous skin cancers: There are no specific guidelines but I recommend minimize UV exposure and generally recommend annual full-body skin exams

## 2023-09-04 NOTE — Assessment & Plan Note (Signed)
Continue above surveillance.

## 2023-09-04 NOTE — Progress Notes (Signed)
Hematology/Oncology Consult Note [phone] [phone]     REFERRING PROVIDER: Leanna Sato, MD    CHIEF COMPLAINTS/PURPOSE OF CONSULTATION:  High risk breast cancer  ASSESSMENT & PLAN:   BRCA1 gene mutation positive Genetic testing results were reviewed and discussed with patient.  Recommend first-degree relatives to be screened Breast Cancer Screening: Breast awarenessa starting at age 65 years. Clinical breast exam, every 6-12 months Annual mammogram and breast MRIe screening with and without contrast  Discussed about option of risk reduction bilateral mastectomy-patient declines surgery due to heart issue. Limited data on tamoxifen or aromatase inhibitors (AIs) for risk reduction    Ovarian/FallopianTube / Peritoneal / Uterine Cancers : Recommend Surgical risk reduction with bilateral salpingo-oophorectomy-patient declined due to heart issue. Check CA-125 annually.  Recommend patient to see gynecology for pelvic examination and ultrasound of adnexa     Pancreatic Cancer Screening: BR?A1/2 carriers with a first- or second-degree relative from the same side of the family as the pathogenic or likely pathologic variant with exocrine pancreatic ??n??r consider pancreatic ?an??r screening beginning at age 24. -Patient has family history of pancreatic cancer.  Will obtain MRI abdomen with and without contrast annually   Melanoma and non-melanomatous skin cancers: There are no specific guidelines but I recommend minimize UV exposure and generally recommend annual full-body skin exams  History of breast cancer Continue above surveillance.   Orders Placed This Encounter  Procedures   MR BREAST BILATERAL W WO CONTRAST INC CAD    Standing Status:   Future    Standing Expiration Date:   09/03/2024    Order Specific Question:   If indicated for the ordered procedure, I authorize the administration of contrast media per Radiology protocol    Answer:   Yes     Order Specific Question:   What is the patient's sedation requirement?    Answer:   No Sedation    Order Specific Question:   Does the patient have a pacemaker or implanted devices?    Answer:   No    Order Specific Question:   Radiology Contrast Protocol - do NOT remove file path    Answer:   [email or web address]    Order Specific Question:   Preferred imaging location?    Answer:   [facility] (table limit - 550lbs)   MM 3D SCREENING MAMMOGRAM UNILATERAL RIGHT BREAST    Standing Status:   Future    Standing Expiration Date:   09/03/2024    Order Specific Question:   Reason for Exam (SYMPTOM  OR DIAGNOSIS REQUIRED)    Answer:   high risk breast cancer    Order Specific Question:   Preferred imaging location?    Answer:   [facility]   MR Abdomen W Wo Contrast    Standing Status:   Future    Standing Expiration Date:   09/03/2024    Order Specific Question:   If indicated for the ordered procedure, I authorize the administration of contrast media per Radiology protocol    Answer:   Yes    Order Specific Question:   What is the patient's sedation requirement?    Answer:   No Sedation    Order Specific Question:   Does the patient have a pacemaker or implanted devices?    Answer:   No    Order Specific Question:   Preferred imaging location?    Answer:   [facility] (table limit - 550lbs)   CA 125    Standing Status:  Future    Number of Occurrences:   1    Standing Expiration Date:   09/03/2024   Cancer antigen 19-9    Standing Status:   Future    Number of Occurrences:   1    Standing Expiration Date:   09/03/2024   CBC with Differential (Cancer Center Only)    Standing Status:   Future    Standing Expiration Date:   09/03/2024   CMP (Cancer Center only)    Standing Status:   Future    Standing Expiration Date:   09/03/2024   CA 125    Standing Status:   Future    Standing Expiration Date:   09/03/2024   Cancer  antigen 19-9    Standing Status:   Future    Standing Expiration Date:   09/03/2024   Ambulatory referral to Social Work    Referral Priority:   Routine    Referral Type:   Consultation    Referral Reason:   Specialty Services Required    Number of Visits Requested:   1    All questions were answered. The patient knows to call the clinic with any problems, questions or concerns.  Rickard Patience, MD, PhD [facility] Hematology Oncology 09/04/2023    HISTORY OF PRESENTING ILLNESS:  Janice Richmond 67 y.o. female presents to establish care for BRCA1 mutation, history of breast cancer. She was diagnosed with left breast cancer status post mastectomy at age of 67..  Patient reports that she underwent adjuvant chemotherapy. Patient has significant family history of cancer including pancreatic cancer, breast cancer, brain cancer, stomach cancer Her daughter was recently diagnosed with breast cancer and has been undergoing cancer treatments with me. Patient has CHF/cardiomyopathy which was felt to be possibly related to previous chemotherapy.  LVEF 25-30%.  Patient presented to establish care to discuss about surveillance and risk reduction measures. Patient has been getting annual screening right side mammogram annually.  Most recent 1 negative for malignancy.    MEDICAL HISTORY:  Past Medical History:  Diagnosis Date   Breast cancer (HCC) 1993   left breast   Diabetes mellitus without complication (HCC)    GERD (gastroesophageal reflux disease)    HLD (hyperlipidemia)    Hypertension     SURGICAL HISTORY: Past Surgical History:  Procedure Laterality Date   COLONOSCOPY N/A 04/19/2023   Procedure: COLONOSCOPY;  Surgeon: Toledo, Boykin Nearing, MD;  Location: [facility] ENDOSCOPY;  Service: Gastroenterology;  Laterality: N/A;   MASTECTOMY Left 1993   mastectomy with chemotherapy   TEE WITHOUT CARDIOVERSION N/A 02/03/2021   Procedure: TRANSESOPHAGEAL ECHOCARDIOGRAM (TEE);  Surgeon: Lamar Blinks, MD;  Location: [facility] ORS;  Service: Cardiovascular;  Laterality: N/A;    SOCIAL HISTORY: Social History   Socioeconomic History   Marital status: Widowed    Spouse name: Not on file   Number of children: Not on file   Years of education: Not on file   Highest education level: Not on file  Occupational History   Not on file  Tobacco Use   Smoking status: Former   Smokeless tobacco: Never  Vaping Use   Vaping status: Never Used  Substance and Sexual Activity   Alcohol use: Never   Drug use: Never   Sexual activity: Not on file  Other Topics Concern   Not on file  Social History Narrative   Not on file   Social Determinants of Health   Financial Resource Strain: Not on file  Food Insecurity: Food  Insecurity Present (09/04/2023)   Hunger Vital Sign    Worried About Running Out of Food in the Last Year: Sometimes true    Ran Out of Food in the Last Year: Sometimes true  Transportation Needs: Unmet Transportation Needs (09/04/2023)   PRAPARE - Administrator, Civil Service (Medical): No    Lack of Transportation (Non-Medical): Yes  Physical Activity: Not on file  Stress: Not on file  Social Connections: Not on file  Intimate Partner Violence: Not At Risk (09/04/2023)   Humiliation, Afraid, Rape, and Kick questionnaire    Fear of Current or Ex-Partner: No    Emotionally Abused: No    Physically Abused: No    Sexually Abused: No    FAMILY HISTORY: Family History  Problem Relation Age of Onset   Pancreatic cancer Mother 2   Lung cancer Father    Breast cancer Sister 70       2 sisters, ages 7 and 10   Brain cancer Brother    Lung cancer Brother    Stomach cancer Maternal Grandmother    Breast cancer Other 40       did have BRCA testing and it was negative   Breast cancer Other 49       BRCA1+   Breast cancer Daughter 54       BRCA1+   Pancreatic cancer Cousin     ALLERGIES:  has No Known Allergies.  MEDICATIONS:  Current Outpatient  Medications  Medication Sig Dispense Refill   amLODipine (NORVASC) 5 MG tablet Take 5 mg by mouth daily.     aspirin 81 MG EC tablet Take 1 tablet by mouth daily.     atorvastatin (LIPITOR) 40 MG tablet Take 1 tablet (40 mg total) by mouth daily. 30 tablet 0   carvedilol (COREG) 25 MG tablet Take 25 mg by mouth 2 (two) times daily.     cephALEXin (KEFLEX) 500 MG capsule Take 1 capsule (500 mg total) by mouth 2 (two) times daily. 14 capsule 0   fluticasone (FLONASE) 50 MCG/ACT nasal spray Place into the nose.     latanoprost (XALATAN) 0.005 % ophthalmic solution Place 1 drop into both eyes at bedtime.     linagliptin (TRADJENTA) 5 MG TABS tablet Take by mouth.     Loratadine 10 MG CAPS Take by mouth.     losartan (COZAAR) 25 MG tablet Take 1 tablet (25 mg total) by mouth daily. 30 tablet 0   Magnesium Oxide 400 MG CAPS Take 1 capsule by mouth 2 (two) times daily.     meloxicam (MOBIC) 7.5 MG tablet      metFORMIN (GLUCOPHAGE) 500 MG tablet Take 500 mg by mouth 2 (two) times daily.     methocarbamol (ROBAXIN) 500 MG tablet Take 1 tablet twice a day by oral route.     oxyCODONE-acetaminophen (PERCOCET/ROXICET) 5-325 MG tablet      pantoprazole (PROTONIX) 40 MG tablet Take 1 tablet (40 mg total) by mouth daily. 30 tablet 0   PATADAY 0.2 % SOLN Apply to eye.     spironolactone (ALDACTONE) 25 MG tablet Take 1 tablet (25 mg total) by mouth daily. 30 tablet 0   timolol (TIMOPTIC) 0.5 % ophthalmic solution Place 1 drop into both eyes daily.     valsartan (DIOVAN) 160 MG tablet Take 2 tablets by mouth daily.     clopidogrel (PLAVIX) 75 MG tablet Take 1 tablet (75 mg total) by mouth daily. (Patient not taking: Reported on 04/19/2023)  30 tablet 0   No current facility-administered medications for this visit.    Review of Systems  Constitutional:  Negative for appetite change, chills, fatigue and fever.  HENT:   Negative for hearing loss and voice change.   Eyes:  Negative for eye problems.   Respiratory:  Negative for chest tightness and cough.   Cardiovascular:  Negative for chest pain.  Gastrointestinal:  Negative for abdominal distention, abdominal pain and blood in stool.  Endocrine: Negative for hot flashes.  Genitourinary:  Negative for difficulty urinating and frequency.   Musculoskeletal:  Negative for arthralgias.  Skin:  Negative for itching and rash.  Neurological:  Negative for extremity weakness.  Hematological:  Negative for adenopathy.  Psychiatric/Behavioral:  Negative for confusion.      PHYSICAL EXAMINATION: ECOG PERFORMANCE STATUS: 1 - Symptomatic but completely ambulatory  Vitals:   09/04/23 1102 09/04/23 1114  BP: (!) 162/85 (!) 149/92  Pulse: 87   Resp: 18   Temp: (!) 96 F (35.6 C)   SpO2: 100%    Filed Weights   09/04/23 1102  Weight: 205 lb 9.6 oz (93.3 kg)    Physical Exam Constitutional:      General: She is not in acute distress.    Appearance: She is not diaphoretic.  HENT:     Head: Normocephalic and atraumatic.  Eyes:     General: No scleral icterus. Cardiovascular:     Rate and Rhythm: Normal rate and regular rhythm.     Heart sounds: No murmur heard. Pulmonary:     Effort: Pulmonary effort is normal. No respiratory distress.     Breath sounds: No rales.  Chest:     Chest wall: No tenderness.  Abdominal:     General: There is no distension.     Palpations: Abdomen is soft.     Tenderness: There is no abdominal tenderness.  Musculoskeletal:        General: Normal range of motion.     Cervical back: Normal range of motion and neck supple.  Skin:    General: Skin is warm and dry.     Findings: No erythema.  Neurological:     Mental Status: She is alert and oriented to person, place, and time.     Cranial Nerves: No cranial nerve deficit.     Motor: No abnormal muscle tone.     Coordination: Coordination normal.  Psychiatric:        Mood and Affect: Affect normal.      LABORATORY DATA:  I have reviewed the  data as listed    Latest Ref Rng & Units 08/30/2023    7:13 PM 04/28/2021   11:01 PM 02/16/2021    7:38 PM  CBC  WBC 4.0 - 10.5 K/uL 5.3  7.4  6.2   Hemoglobin 12.0 - 15.0 g/dL 40.9  81.1  91.4   Hematocrit 36.0 - 46.0 % 37.2  40.1  39.1   Platelets 150 - 400 K/uL 235  233  256       Latest Ref Rng & Units 08/30/2023    7:13 PM 04/28/2021   11:01 PM 02/16/2021    7:38 PM  CMP  Glucose 70 - 99 mg/dL 782  956  213   BUN 8 - 23 mg/dL 21  14  16    Creatinine 0.44 - 1.00 mg/dL 0.86  5.78  4.69   Sodium 135 - 145 mmol/L 137  136  136   Potassium 3.5 - 5.1 mmol/L 3.5  3.9  4.0   Chloride 98 - 111 mmol/L 103  106  103   CO2 22 - 32 mmol/L 26  23  23    Calcium 8.9 - 10.3 mg/dL 8.9  9.2  9.3      RADIOGRAPHIC STUDIES: I have personally reviewed the radiological images as listed and agreed with the findings in the report. No results found.

## 2023-09-05 ENCOUNTER — Telehealth: Payer: Self-pay

## 2023-09-05 LAB — CANCER ANTIGEN 19-9: CA 19-9: <2 U/mL (ref 0–35)

## 2023-09-05 LAB — CA 125: Cancer Antigen (CA) 125: 9.7 U/mL (ref 0.0–38.1)

## 2023-09-05 NOTE — Telephone Encounter (Signed)
Clinical Social Work was referred by medical provider for assessment of psychosocial needs.  CSW attempted to contact patient by phone.  Attempted to leave a message, but her vm was full.  Will attempt at a later time.

## 2023-09-06 ENCOUNTER — Telehealth: Payer: Self-pay

## 2023-09-06 NOTE — Telephone Encounter (Signed)
Spoke to pt and informed her of this.

## 2023-09-06 NOTE — Telephone Encounter (Signed)
-----   Message from Rickard Patience sent at 09/05/2023  9:25 PM EDT ----- Please let patient know that her tumor markers are normal

## 2023-09-08 ENCOUNTER — Telehealth: Payer: Self-pay | Admitting: Oncology

## 2023-09-08 NOTE — Telephone Encounter (Signed)
Per [facility] secure chat, Mri needs to be canceled as insurance authorization has not went through.  MRI for 11/4 has been canceled.   Called pt and vmbx is full so I could not leave a message

## 2023-09-11 ENCOUNTER — Ambulatory Visit: Admission: RE | Admit: 2023-09-11 | Payer: Medicare PPO | Source: Ambulatory Visit

## 2023-12-12 ENCOUNTER — Ambulatory Visit
Admission: RE | Admit: 2023-12-12 | Discharge: 2023-12-12 | Disposition: A | Discharge status: Home / Self Care | Payer: Medicare PPO | Source: Ambulatory Visit | Attending: Oncology | Admitting: Oncology

## 2023-12-12 DIAGNOSIS — Z1501 Genetic susceptibility to malignant neoplasm of breast: Secondary | ICD-10-CM | POA: Diagnosis present

## 2023-12-12 DIAGNOSIS — Z1509 Genetic susceptibility to other malignant neoplasm: Secondary | ICD-10-CM | POA: Diagnosis present

## 2023-12-12 DIAGNOSIS — Z853 Personal history of malignant neoplasm of breast: Secondary | ICD-10-CM

## 2023-12-12 MED ORDER — GADOBUTROL 1 MMOL/ML IV SOLN: 9.0000 mL | Freq: Once | INTRAVENOUS | Status: DC | PRN

## 2023-12-12 MED ORDER — GADOBUTROL 1 MMOL/ML IV SOLN
9.0000 mL | Freq: Once | INTRAVENOUS | Status: AC | PRN
Start: 2023-12-12 — End: 2023-12-12
  Administered 2023-12-12: 9 mL via INTRAVENOUS

## 2024-01-07 ENCOUNTER — Emergency Department
Admission: EM | Admit: 2024-01-07 | Discharge: 2024-01-07 | Disposition: A | Discharge status: Home / Self Care | Attending: Emergency Medicine | Admitting: Emergency Medicine

## 2024-01-07 ENCOUNTER — Other Ambulatory Visit: Payer: Self-pay

## 2024-01-07 DIAGNOSIS — K649 Unspecified hemorrhoids: Secondary | ICD-10-CM | POA: Diagnosis not present

## 2024-01-07 DIAGNOSIS — I10 Essential (primary) hypertension: Secondary | ICD-10-CM | POA: Insufficient documentation

## 2024-01-07 DIAGNOSIS — Z853 Personal history of malignant neoplasm of breast: Secondary | ICD-10-CM | POA: Diagnosis not present

## 2024-01-07 DIAGNOSIS — K921 Melena: Secondary | ICD-10-CM | POA: Diagnosis present

## 2024-01-07 DIAGNOSIS — E119 Type 2 diabetes mellitus without complications: Secondary | ICD-10-CM | POA: Insufficient documentation

## 2024-01-07 LAB — CBC
HCT: 40 % (ref 36.0–46.0)
Hemoglobin: 12.6 g/dL (ref 12.0–15.0)
MCH: 28.7 pg (ref 26.0–34.0)
MCHC: 31.5 g/dL (ref 30.0–36.0)
MCV: 91.1 fL (ref 80.0–100.0)
Platelets: 263 10*3/uL (ref 150–400)
RBC: 4.39 MIL/uL (ref 3.87–5.11)
RDW: 15.4 % (ref 11.5–15.5)
WBC: 6.4 10*3/uL (ref 4.0–10.5)
nRBC: 0 % (ref 0.0–0.2)

## 2024-01-07 LAB — COMPREHENSIVE METABOLIC PANEL
ALT: 20 U/L (ref 0–44)
AST: 21 U/L (ref 15–41)
Albumin: 4 g/dL (ref 3.5–5.0)
Alkaline Phosphatase: 81 U/L (ref 38–126)
Anion gap: 10 (ref 5–15)
BUN: 22 mg/dL (ref 8–23)
CO2: 24 mmol/L (ref 22–32)
Calcium: 9.4 mg/dL (ref 8.9–10.3)
Chloride: 103 mmol/L (ref 98–111)
Creatinine, Ser: 0.67 mg/dL (ref 0.44–1.00)
GFR, Estimated: >60 mL/min (ref >60)
Glucose, Bld: 233 mg/dL — ABNORMAL HIGH (ref 70–99)
Potassium: 4.1 mmol/L (ref 3.5–5.1)
Sodium: 137 mmol/L (ref 135–145)
Total Bilirubin: 0.7 mg/dL (ref 0.0–1.2)
Total Protein: 7.5 g/dL (ref 6.5–8.1)

## 2024-01-07 NOTE — ED Triage Notes (Signed)
 Reports was in the shower and saw bright red blood from her bottom.  Reports unsure if it was actually from vagina.  Hasn't had a menstrual since she was 47.  Complains of mild lower abd cramping.

## 2024-01-07 NOTE — Discharge Instructions (Signed)
 Please follow-up with your primary care provider if your symptoms become recurrent.  Return to the emergency department if symptoms change or worsen and you are unable to see primary care.

## 2024-01-07 NOTE — ED Provider Notes (Signed)
 [facility] Provider Note    Event Date/Time   First MD Initiated Contact with Patient 01/07/24 1005     (approximate)   History   Blood In Stools   HPI  Janice Richmond is a 68 y.o. female  with history of breast cancer, CVA, DM2 and as listed in EMR presents to the emergency department for evaluation of bleeding either from her vagina or rectum. She states that her stomach feels "like a menstrual cycle,", but she hasn't had a period since age 72. She noticed the bleeding this morning when she wiped. She has had a hemorrhoid in the past. Bleeding has stopped. She denies constipation, diarrhea, nausea, vomiting, or abdominal pain.      Physical Exam   Triage Vital Signs: ED Triage Vitals [01/07/24 0937]  Encounter Vitals Group     BP (!) 168/90     Systolic BP Percentile      Diastolic BP Percentile      Pulse Rate 93     Resp 18     Temp 98 F (36.7 C)     Temp Source Oral     SpO2 100 %     Weight 205 lb (93 kg)     Height 5\' 5"  (1.651 m)     Head Circumference      Peak Flow      Pain Score 0     Pain Loc      Pain Education      Exclude from Growth Chart     Most recent vital signs: Vitals:   01/07/24 0937  BP: (!) 168/90  Pulse: 93  Resp: 18  Temp: 98 F (36.7 C)  SpO2: 100%    General: Awake, no distress.  CV:  Good peripheral perfusion.  Resp:  Normal effort.  Abd:  No distention.  Other:  2 small external, not thrombosed hemorrhoids. No obvious blood on DRE.   No blood in vaginal vault    ED Results / Procedures / Treatments   Labs (all labs ordered are listed, but only abnormal results are displayed) Labs Reviewed  COMPREHENSIVE METABOLIC PANEL - Abnormal; Notable for the following components:      Result Value   Glucose, Bld 233 (*)    All other components within normal limits  CBC  POC OCCULT BLOOD, ED     EKG  Not indicated.   RADIOLOGY  Image and radiology report reviewed and interpreted by me.  Radiology report consistent with the same.  Not indicated.  PROCEDURES:  Critical Care performed: No  Procedures  DRE, vaginal exam  MEDICATIONS ORDERED IN ED:  Medications - No data to display   IMPRESSION / MDM / ASSESSMENT AND PLAN / ED COURSE   I have reviewed the triage note.  Differential diagnosis includes, but is not limited to, GI bleed, bleeding hemorrhoids, vaginal bleeding, cervical cancer  Patient's presentation is most consistent with acute illness / injury with system symptoms.  68 year old female presenting to the emergency department for evaluation after noticing blood coming from either her vagina or her rectum this morning.  She is unsure which.  She states that the blood was just noticed when she wiped and it was not a large amount.  No bleeding since.  She has had the feeling in her stomach like she was going to start her menstrual cycle however she has not had that since she was age 43.  She also reports some intermittent nausea over  the past week or so that improves with food and moving around.  She currently denies nausea.  Chart review indicates that she had a normal colonoscopy in June 2024.  Exam is reassuring.  No obvious blood on digital rectal exam.  She does have 2 external hemorrhoids that do not appear thrombosed and not actively bleeding.  Hemoccult negative.  No blood in the vaginal vault.  Vital signs are unremarkable with the exception of mild hypertension.  Blood noted earlier will likely from her hemorrhoids.  No other source found on exam.  Lab studies are unremarkable.  Patient feels reassured and will be discharged home.  She was advised that if the nausea continues or if she notices blood when wiping she should schedule follow-up appointment with her primary care provider.  She was advised that if she has active vaginal or rectal bleeding that is more than just when wiping, she should return to the emergency department.      FINAL  CLINICAL IMPRESSION(S) / ED DIAGNOSES   Final diagnoses:  Bleeding hemorrhoid     Rx / DC Orders   ED Discharge Orders     None        Note:  This document was prepared using Dragon voice recognition software and may include unintentional dictation errors.   Chinita Pester, FNP 01/07/24 1047    Claybon Jabs, MD 01/07/24 [phone]

## 2024-01-07 NOTE — ED Notes (Signed)
 See triage notes. Patient c/o blood in her stool that started this morning. Patient denies any issues with passing bowel.

## 2024-01-08 ENCOUNTER — Other Ambulatory Visit: Payer: Self-pay

## 2024-01-08 ENCOUNTER — Telehealth: Payer: Self-pay

## 2024-01-08 NOTE — Telephone Encounter (Signed)
-----   Message from Rickard Patience sent at 01/07/2024 10:45 PM EST ----- Please arrange her to get RIGHT screening mammogram in 6 months. Thanks.  zy

## 2024-01-08 NOTE — Telephone Encounter (Signed)
 Janice Richmond can you please schedule patient for right screening mammogram in six months and notify patient of appointment date and time. Order is in.

## 2024-06-26 ENCOUNTER — Ambulatory Visit
Admission: RE | Admit: 2024-06-26 | Discharge: 2024-06-26 | Disposition: A | Discharge status: Home / Self Care | Source: Ambulatory Visit | Attending: Oncology | Admitting: Oncology

## 2024-06-26 DIAGNOSIS — Z1231 Encounter for screening mammogram for malignant neoplasm of breast: Secondary | ICD-10-CM | POA: Diagnosis not present

## 2024-06-26 DIAGNOSIS — Z1509 Genetic susceptibility to other malignant neoplasm: Secondary | ICD-10-CM | POA: Insufficient documentation

## 2024-06-26 DIAGNOSIS — Z1501 Genetic susceptibility to malignant neoplasm of breast: Secondary | ICD-10-CM | POA: Insufficient documentation

## 2024-06-26 DIAGNOSIS — Z853 Personal history of malignant neoplasm of breast: Secondary | ICD-10-CM | POA: Insufficient documentation

## 2024-09-04 ENCOUNTER — Inpatient Hospital Stay: Payer: Medicare PPO | Admitting: Oncology

## 2024-09-04 ENCOUNTER — Inpatient Hospital Stay: Payer: Medicare PPO

## 2024-09-04 ENCOUNTER — Telehealth: Payer: Self-pay | Admitting: Oncology

## 2024-09-04 NOTE — Telephone Encounter (Signed)
 Pt was scheduled today for 34yr lab/md.  Pt no showed.  I called pt and asked if she wanted to r/s. She stated no and to just cancel the appt. Appts canceled and noted.

## 2024-09-04 NOTE — Assessment & Plan Note (Deleted)
 Genetic testing results were reviewed and discussed with patient.  Recommend first-degree relatives to be screened Breast Cancer Screening: Breast awarenessa starting at age 66 years. Clinical breast exam, every 6-12 months Annual mammogram and breast MRIe screening with and without contrast  Discussed about option of risk reduction bilateral mastectomy-patient declines surgery due to heart issue. Limited data on tamoxifen or aromatase inhibitors (AIs) for risk reduction    Ovarian/FallopianTube / Peritoneal / Uterine Cancers : Recommend Surgical risk reduction with bilateral salpingo-oophorectomy-patient declined due to heart issue. Check CA-125 annually.  Recommend patient to see gynecology for pelvic examination and ultrasound of adnexa     Pancreatic Cancer Screening: BR?A1/2 carriers with a first- or second-degree relative from the same side of the family as the pathogenic or likely pathologic variant with exocrine pancreatic ??n??r consider pancreatic ?an??r screening beginning at age 40. -Patient has family history of pancreatic cancer.  Will obtain MRI abdomen with and without contrast annually   Melanoma and non-melanomatous skin cancers: There are no specific guidelines but I recommend minimize UV exposure and generally recommend annual full-body skin exams

## 2024-09-04 NOTE — Assessment & Plan Note (Deleted)
 Continue above surveillance.
